# Patient Record
Sex: Male | Born: 1990 | Race: White | Hispanic: No | Marital: Single | Smoking: Current every day smoker
Health system: Southern US, Community
[De-identification: ages and names within clinical notes are randomized; demographics above are authoritative.]

## PROBLEM LIST (undated history)

## (undated) DIAGNOSIS — F909 Attention-deficit hyperactivity disorder, unspecified type: Secondary | ICD-10-CM

## (undated) DIAGNOSIS — F319 Bipolar disorder, unspecified: Secondary | ICD-10-CM

## (undated) HISTORY — PX: TYMPANOSTOMY TUBE PLACEMENT: SHX32

---
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
## 2009-12-19 ENCOUNTER — Emergency Department (HOSPITAL_COMMUNITY): Admission: EM | Admit: 2009-12-19 | Discharge: 2009-12-19 | Payer: Self-pay | Admitting: Emergency Medicine

## 2011-05-04 ENCOUNTER — Encounter: Payer: Self-pay | Admitting: Emergency Medicine

## 2011-05-04 ENCOUNTER — Emergency Department (HOSPITAL_COMMUNITY): Payer: Medicaid Other

## 2011-05-04 ENCOUNTER — Emergency Department (HOSPITAL_COMMUNITY)
Admission: EM | Admit: 2011-05-04 | Discharge: 2011-05-04 | Disposition: A | Discharge status: Home / Self Care | Payer: Medicaid Other | Attending: Emergency Medicine | Admitting: Emergency Medicine

## 2011-05-04 DIAGNOSIS — S60229A Contusion of unspecified hand, initial encounter: Secondary | ICD-10-CM | POA: Insufficient documentation

## 2011-05-04 DIAGNOSIS — M79609 Pain in unspecified limb: Secondary | ICD-10-CM | POA: Insufficient documentation

## 2011-05-04 DIAGNOSIS — Z5189 Encounter for other specified aftercare: Secondary | ICD-10-CM

## 2011-05-04 DIAGNOSIS — S0180XA Unspecified open wound of other part of head, initial encounter: Secondary | ICD-10-CM | POA: Insufficient documentation

## 2011-05-04 DIAGNOSIS — F17200 Nicotine dependence, unspecified, uncomplicated: Secondary | ICD-10-CM | POA: Insufficient documentation

## 2011-05-04 MED ORDER — TETANUS-DIPHTH-ACELL PERTUSSIS 5-2.5-18.5 LF-MCG/0.5 IM SUSP
0.5000 mL | Freq: Once | INTRAMUSCULAR | Status: AC
  Administered 2011-05-04: 0.5 mL via INTRAMUSCULAR
  Filled 2011-05-04: qty 0.5

## 2011-05-04 NOTE — ED Provider Notes (Signed)
Medical screening examination/treatment/procedure(s) were performed by non-physician practitioner and as supervising physician I was immediately available for consultation/collaboration.   Caisley Baxendale L Jorryn Casagrande, MD 05/04/11 2249 
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
## 2012-02-03 ENCOUNTER — Encounter (HOSPITAL_COMMUNITY): Payer: Self-pay | Admitting: *Deleted

## 2012-02-03 ENCOUNTER — Emergency Department (HOSPITAL_COMMUNITY)
Admission: EM | Admit: 2012-02-03 | Discharge: 2012-02-03 | Disposition: A | Discharge status: Home / Self Care | Payer: Medicaid Other | Attending: Emergency Medicine | Admitting: Emergency Medicine

## 2012-02-03 DIAGNOSIS — M25529 Pain in unspecified elbow: Secondary | ICD-10-CM | POA: Insufficient documentation

## 2012-02-03 DIAGNOSIS — IMO0001 Reserved for inherently not codable concepts without codable children: Secondary | ICD-10-CM | POA: Insufficient documentation

## 2012-02-03 DIAGNOSIS — R609 Edema, unspecified: Secondary | ICD-10-CM | POA: Insufficient documentation

## 2012-02-03 DIAGNOSIS — L0390 Cellulitis, unspecified: Secondary | ICD-10-CM

## 2012-02-03 DIAGNOSIS — M25429 Effusion, unspecified elbow: Secondary | ICD-10-CM | POA: Insufficient documentation

## 2012-02-03 DIAGNOSIS — L539 Erythematous condition, unspecified: Secondary | ICD-10-CM | POA: Insufficient documentation

## 2012-02-03 MED ORDER — DOXYCYCLINE HYCLATE 100 MG PO CAPS: 100.0000 mg | ORAL_CAPSULE | Freq: Two times a day (BID) | ORAL | Status: AC

## 2012-02-03 MED ORDER — DOXYCYCLINE HYCLATE 100 MG PO TABS
100.0000 mg | ORAL_TABLET | Freq: Once | ORAL | Status: AC
  Administered 2012-02-03: 100 mg via ORAL
  Filled 2012-02-03: qty 1

## 2012-02-03 MED ORDER — HYDROCODONE-ACETAMINOPHEN 5-325 MG PO TABS
ORAL_TABLET | ORAL | Status: AC
Start: 2012-02-03 — End: 2012-02-13

## 2012-02-03 MED ORDER — HYDROCODONE-ACETAMINOPHEN 5-325 MG PO TABS
1.0000 | ORAL_TABLET | Freq: Once | ORAL | Status: AC
  Administered 2012-02-03: 1 via ORAL
  Filled 2012-02-03: qty 1

## 2012-02-03 NOTE — ED Notes (Signed)
Pt reports spider bite on right elbow, sustained 2 days ago.  Elbow swollen, red and pt reporting throbbing pain.

## 2012-02-03 NOTE — ED Provider Notes (Signed)
History     CSN: [id number]  Arrival date & time 02/03/12  2011   None     Chief Complaint  Patient presents with  . Insect Bite    (Consider location/radiation/quality/duration/timing/severity/associated sxs/prior treatment) HPI Comments: Patient c/o pain, redness and swelling to the right posterior elbow for 2 days.  States that he believes that he was stung or bitten by something.  Pain is worse with flexion and improves with rest.  He states that he tried to "pop it" with a needle, but area did not drain.  He denies numbness or weakness.    The history is provided by the patient.    History reviewed. No pertinent past medical history.  History reviewed. No pertinent past surgical history.  History reviewed. No pertinent family history.  History  Substance Use Topics  . Smoking status: Current Everyday Smoker -- 1.0 packs/day    Types: Cigarettes  . Smokeless tobacco: Not on file  . Alcohol Use: No      Review of Systems  Constitutional: Negative for fever and chills.  Genitourinary: Negative for dysuria and difficulty urinating.  Musculoskeletal: Positive for joint swelling and arthralgias. Negative for back pain.  Skin: Positive for color change and wound.  Neurological: Negative for dizziness, weakness, numbness and headaches.  All other systems reviewed and are negative.    Allergies  Sulfa drugs cross reactors and Penicillins  Home Medications  No current outpatient prescriptions on file.  BP 137/89  Pulse 71  Temp 98.1 F (36.7 C)  Resp 16  Ht 6\' 3"  (1.905 m)  Wt 175 lb (79.379 kg)  BMI 21.87 kg/m2  SpO2 100%  Physical Exam  Nursing note and vitals reviewed. Constitutional: He is oriented to person, place, and time. He appears well-developed and well-nourished. No distress.  HENT:  Head: Normocephalic and atraumatic.  Cardiovascular: Normal rate, regular rhythm and normal heart sounds.   Pulmonary/Chest: Effort normal and breath sounds  normal.  Musculoskeletal: He exhibits edema and tenderness.       Right elbow: He exhibits swelling. He exhibits normal range of motion, no effusion, no deformity and no laceration.       Arms:      ttp of the posterior right elbow. Localized erythema, slight induration, no fluctuance or drainage.  Radial pulse is brisk, sensation intact.  CR< 2 sec.  Patient has full ROM.  Neurological: He is alert and oriented to person, place, and time. He exhibits normal muscle tone. Coordination normal.  Skin: Skin is warm and dry. There is erythema.       See MS exam    ED Course  Procedures (including critical care time)  Labs Reviewed - No data to display      MDM    Localized area of edema an erythema to the right posterior elbow.  Leading edge of erythema was marked by me. Likely cellulitis vs early abscess. No drainage, or fluctuance at this time.  Distal sensation intact, grip strength is strong and equal biltaterally, CR< 2sec.   Patient agrees to return here in 1-2 days for recheck.  I will prescribe doxy and norco #24.     Patient / Family / Caregiver understand and agree with initial ED impression and plan with expectations set for ED visit. Pt stable in ED with no significant deterioration in condition. Pt feels improved after observation and/or treatment in ED.       Rochella Benner L. [place], [place] 02/05/12 4098

## 2012-02-03 NOTE — ED Notes (Signed)
Noticed a possible bite to the right elbow, denies any drainage. Area about 5cm diameter. Swelling & redness noted.

## 2012-02-03 NOTE — ED Notes (Signed)
Pt alert & oriented x4, stable gait. Pt given discharge instructions, paperwork & prescription(s). Patient instructed to stop at the registration desk to finish any additional paperwork. pt verbalized understanding. Pt left department w/ no further questions.  

## 2012-02-06 ENCOUNTER — Ambulatory Visit (HOSPITAL_COMMUNITY): Payer: Medicaid Other

## 2012-02-06 ENCOUNTER — Encounter (HOSPITAL_COMMUNITY): Payer: Self-pay | Admitting: *Deleted

## 2012-02-06 ENCOUNTER — Emergency Department (HOSPITAL_COMMUNITY)
Admission: EM | Admit: 2012-02-06 | Discharge: 2012-02-06 | Disposition: A | Discharge status: Home / Self Care | Payer: Medicaid Other | Attending: Emergency Medicine | Admitting: Emergency Medicine

## 2012-02-06 ENCOUNTER — Telehealth (HOSPITAL_COMMUNITY): Payer: Self-pay | Admitting: Oncology

## 2012-02-06 DIAGNOSIS — L089 Local infection of the skin and subcutaneous tissue, unspecified: Secondary | ICD-10-CM | POA: Insufficient documentation

## 2012-02-06 DIAGNOSIS — Z880 Allergy status to penicillin: Secondary | ICD-10-CM | POA: Insufficient documentation

## 2012-02-06 DIAGNOSIS — F17200 Nicotine dependence, unspecified, uncomplicated: Secondary | ICD-10-CM | POA: Insufficient documentation

## 2012-02-06 DIAGNOSIS — Z882 Allergy status to sulfonamides status: Secondary | ICD-10-CM | POA: Insufficient documentation

## 2012-02-06 DIAGNOSIS — L0390 Cellulitis, unspecified: Secondary | ICD-10-CM

## 2012-02-06 DIAGNOSIS — W57XXXA Bitten or stung by nonvenomous insect and other nonvenomous arthropods, initial encounter: Secondary | ICD-10-CM

## 2012-02-06 MED ORDER — VANCOMYCIN HCL IN DEXTROSE 1-5 GM/200ML-% IV SOLN
1000.0000 mg | INTRAVENOUS | Status: AC
  Filled 2012-02-06 (×3): qty 200

## 2012-02-06 MED ORDER — VANCOMYCIN HCL IN DEXTROSE 1-5 GM/200ML-% IV SOLN
1000.0000 mg | Freq: Once | INTRAVENOUS | Status: AC
  Administered 2012-02-06: 1000 mg via INTRAVENOUS
  Filled 2012-02-06: qty 200

## 2012-02-06 MED ORDER — HYDROCODONE-ACETAMINOPHEN 5-325 MG PO TABS
1.0000 | ORAL_TABLET | Freq: Once | ORAL | Status: AC
  Administered 2012-02-06: 1 via ORAL
  Filled 2012-02-06: qty 1

## 2012-02-06 NOTE — ED Provider Notes (Signed)
History     CSN: [id number]  Arrival date & time 02/06/12  8469   First MD Initiated Contact with Patient 02/06/12 0408      Chief Complaint  Patient presents with  . Insect Bite    (Consider location/radiation/quality/duration/timing/severity/associated sxs/prior treatment) HPI  Kevin Robertson is a 21 y.o. male who presents to the Emergency Department complaining of increased redness, swelling to insect bite to his right elbow region. He was seen and treatment begun on 02/03/12. At that time the edges of the erythema were marked. The erythema and dwelling have extended well beyond the marked border.He denies fever, chills. He reports increased pain to the site. He has been compliant with his doxycycline.    History reviewed. No pertinent past medical history.  History reviewed. No pertinent past surgical history.  No family history on file.  History  Substance Use Topics  . Smoking status: Current Everyday Smoker -- 1.0 packs/day    Types: Cigarettes  . Smokeless tobacco: Not on file  . Alcohol Use: No      Review of Systems  Constitutional: Negative for fever.       10 Systems reviewed and are negative for acute change except as noted in the HPI.  HENT: Negative for congestion.   Eyes: Negative for discharge and redness.  Respiratory: Negative for cough and shortness of breath.   Cardiovascular: Negative for chest pain.  Gastrointestinal: Negative for vomiting and abdominal pain.  Musculoskeletal: Negative for back pain.  Skin: Negative for rash.       Increased erythema, swelling to insect bite site  Neurological: Negative for syncope, numbness and headaches.  Psychiatric/Behavioral:       No behavior change.    Allergies  Sulfa drugs cross reactors and Penicillins  Home Medications   Current Outpatient Rx  Name Route Sig Dispense Refill  . DOXYCYCLINE HYCLATE 100 MG PO CAPS Oral Take 1 capsule (100 mg total) by mouth 2 (two) times daily. 20 capsule 0  .  HYDROCODONE-ACETAMINOPHEN 5-325 MG PO TABS  Take one-two tabs po q 4-6 hrs prn pain 24 tablet 0    BP 110/75  Pulse 70  Temp 97.4 F (36.3 C) (Oral)  Resp 20  Ht 6\' 3"  (1.905 m)  Wt 175 lb (79.379 kg)  BMI 21.87 kg/m2  SpO2 100%  Physical Exam  Nursing note and vitals reviewed. Constitutional:       Awake, alert, nontoxic appearance.  HENT:  Head: Atraumatic.  Eyes: Right eye exhibits no discharge. Left eye exhibits no discharge.  Neck: Neck supple.  Pulmonary/Chest: Effort normal. He exhibits no tenderness.  Abdominal: Soft. There is no tenderness. There is no rebound.  Musculoskeletal: He exhibits no tenderness.       Baseline ROM, no obvious new focal weakness.  Neurological:       Mental status and motor strength appears baseline for patient and situation.  Skin: No rash noted. There is erythema.       Area of erythema surrounding the nsect bite to the patients right elbow has increased from the marked area delineated on 02/03/12. Now 5 x 12 cm  Psychiatric: He has a normal mood and affect.    ED Course  Procedures (including critical care time)      MDM  Patient with infected insect bite to right elbow area that has increased erythema and swelling since initiation of PO antibiotic treatment on 02/03/12. Began IV antibiotics. Will arrange for [facility]. Pt stable in ED with no significant deterioration in condition.The patient appears reasonably screened and/or stabilized for discharge and I doubt any other medical condition or other [facility] requiring further screening, evaluation, or treatment in the ED at this time prior to discharge.  MDM Reviewed: nursing note and vitals           Nicoletta Dress. Colon Branch, MD 02/06/12 [phone]

## 2012-02-06 NOTE — ED Notes (Signed)
Pt reports being seen on 7/2 for what he believes is a spider bite on right elbow.  Skin was marked on that visit, and pt was instructed to return if redness extended past marking.  Area red, swollen and warm to touch.

## 2012-02-06 NOTE — ED Provider Notes (Signed)
Medical screening examination/treatment/procedure(s) were performed by non-physician practitioner and as supervising physician I was immediately available for consultation/collaboration.   Laray Anger, DO 02/06/12 1657

## 2012-02-07 ENCOUNTER — Encounter (HOSPITAL_COMMUNITY): Payer: Medicaid Other | Attending: Emergency Medicine

## 2012-02-07 ENCOUNTER — Telehealth (HOSPITAL_COMMUNITY): Payer: Self-pay

## 2012-02-07 NOTE — Telephone Encounter (Signed)
Patient did not show for appointment for IV antibiotics.  Message left on voicemail for patient to contact clinic regarding appointment.

## 2012-02-08 ENCOUNTER — Ambulatory Visit (HOSPITAL_COMMUNITY): Payer: Medicaid Other

## 2012-02-09 ENCOUNTER — Encounter (HOSPITAL_COMMUNITY): Payer: Medicaid Other

## 2013-12-09 ENCOUNTER — Emergency Department (HOSPITAL_COMMUNITY): Payer: Medicaid Other

## 2013-12-09 ENCOUNTER — Encounter (HOSPITAL_COMMUNITY): Payer: Self-pay | Admitting: Emergency Medicine

## 2013-12-09 ENCOUNTER — Emergency Department (HOSPITAL_COMMUNITY)
Admission: EM | Admit: 2013-12-09 | Discharge: 2013-12-09 | Disposition: A | Discharge status: Home / Self Care | Payer: Medicaid Other | Attending: Emergency Medicine | Admitting: Emergency Medicine

## 2013-12-09 DIAGNOSIS — Y92410 Unspecified street and highway as the place of occurrence of the external cause: Secondary | ICD-10-CM | POA: Insufficient documentation

## 2013-12-09 DIAGNOSIS — M549 Dorsalgia, unspecified: Secondary | ICD-10-CM

## 2013-12-09 DIAGNOSIS — Z880 Allergy status to penicillin: Secondary | ICD-10-CM | POA: Insufficient documentation

## 2013-12-09 DIAGNOSIS — S0993XA Unspecified injury of face, initial encounter: Secondary | ICD-10-CM | POA: Insufficient documentation

## 2013-12-09 DIAGNOSIS — S3981XA Other specified injuries of abdomen, initial encounter: Secondary | ICD-10-CM | POA: Insufficient documentation

## 2013-12-09 DIAGNOSIS — IMO0002 Reserved for concepts with insufficient information to code with codable children: Secondary | ICD-10-CM | POA: Insufficient documentation

## 2013-12-09 DIAGNOSIS — Y9389 Activity, other specified: Secondary | ICD-10-CM | POA: Insufficient documentation

## 2013-12-09 DIAGNOSIS — R109 Unspecified abdominal pain: Secondary | ICD-10-CM

## 2013-12-09 DIAGNOSIS — M542 Cervicalgia: Secondary | ICD-10-CM

## 2013-12-09 DIAGNOSIS — F17200 Nicotine dependence, unspecified, uncomplicated: Secondary | ICD-10-CM | POA: Insufficient documentation

## 2013-12-09 DIAGNOSIS — S199XXA Unspecified injury of neck, initial encounter: Principal | ICD-10-CM

## 2013-12-09 LAB — BASIC METABOLIC PANEL
BUN: 13 mg/dL (ref 6–23)
CO2: 28 mEq/L (ref 19–32)
Calcium: 9.4 mg/dL (ref 8.4–10.5)
Chloride: 97 mEq/L (ref 96–112)
Creatinine, Ser: 1.18 mg/dL (ref 0.50–1.35)
GFR calc Af Amer: >90 mL/min (ref >90)
GFR calc non Af Amer: 85 mL/min — ABNORMAL LOW (ref >90)
Glucose, Bld: 77 mg/dL (ref 70–99)
Potassium: 3.8 mEq/L (ref 3.7–5.3)
Sodium: 136 mEq/L — ABNORMAL LOW (ref 137–147)

## 2013-12-09 LAB — CBC WITH DIFFERENTIAL/PLATELET
Basophils Absolute: 0.1 10*3/uL (ref 0.0–0.1)
Basophils Relative: 1 % (ref 0–1)
Eosinophils Absolute: 0.2 10*3/uL (ref 0.0–0.7)
Eosinophils Relative: 3 % (ref 0–5)
HCT: 44.3 % (ref 39.0–52.0)
Hemoglobin: 15.5 g/dL (ref 13.0–17.0)
Lymphocytes Relative: 19 % (ref 12–46)
Lymphs Abs: 1.6 10*3/uL (ref 0.7–4.0)
MCH: 30.8 pg (ref 26.0–34.0)
MCHC: 35 g/dL (ref 30.0–36.0)
MCV: 88.1 fL (ref 78.0–100.0)
Monocytes Absolute: 0.6 10*3/uL (ref 0.1–1.0)
Monocytes Relative: 8 % (ref 3–12)
Neutro Abs: 5.7 10*3/uL (ref 1.7–7.7)
Neutrophils Relative %: 69 % (ref 43–77)
Platelets: 178 10*3/uL (ref 150–400)
RBC: 5.03 MIL/uL (ref 4.22–5.81)
RDW: 13.1 % (ref 11.5–15.5)
WBC: 8.2 10*3/uL (ref 4.0–10.5)

## 2013-12-09 MED ORDER — SODIUM CHLORIDE 0.9 % IV BOLUS (SEPSIS)
1000.0000 mL | Freq: Once | INTRAVENOUS | Status: AC
  Administered 2013-12-09: 1000 mL via INTRAVENOUS

## 2013-12-09 MED ORDER — IOHEXOL 300 MG/ML  SOLN
100.0000 mL | Freq: Once | INTRAMUSCULAR | Status: AC | PRN
  Administered 2013-12-09: 100 mL via INTRAVENOUS

## 2013-12-09 MED ORDER — CYCLOBENZAPRINE HCL 10 MG PO TABS: 10.0000 mg | ORAL_TABLET | Freq: Three times a day (TID) | ORAL | Status: DC | PRN

## 2013-12-09 MED ORDER — OXYCODONE-ACETAMINOPHEN 5-325 MG PO TABS: 1.0000 | ORAL_TABLET | ORAL | Status: DC | PRN

## 2013-12-09 MED ORDER — IBUPROFEN 600 MG PO TABS: 600.0000 mg | ORAL_TABLET | Freq: Four times a day (QID) | ORAL | Status: DC | PRN

## 2013-12-09 MED ORDER — KETOROLAC TROMETHAMINE 30 MG/ML IJ SOLN
30.0000 mg | Freq: Once | INTRAMUSCULAR | Status: AC
  Administered 2013-12-09: 30 mg via INTRAVENOUS
  Filled 2013-12-09: qty 1

## 2013-12-09 MED ORDER — HYDROMORPHONE HCL PF 1 MG/ML IJ SOLN
1.0000 mg | Freq: Once | INTRAMUSCULAR | Status: AC
  Administered 2013-12-09: 1 mg via INTRAVENOUS
  Filled 2013-12-09: qty 1
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
## 2013-12-09 NOTE — ED Notes (Signed)
Pt states his pain isn't any better.

## 2013-12-17 ENCOUNTER — Emergency Department (HOSPITAL_COMMUNITY)
Admission: EM | Admit: 2013-12-17 | Discharge: 2013-12-17 | Disposition: A | Discharge status: Home / Self Care | Payer: Medicaid Other | Attending: Emergency Medicine | Admitting: Emergency Medicine

## 2013-12-17 ENCOUNTER — Encounter (HOSPITAL_COMMUNITY): Payer: Self-pay | Admitting: Emergency Medicine

## 2013-12-17 DIAGNOSIS — Z8659 Personal history of other mental and behavioral disorders: Secondary | ICD-10-CM | POA: Insufficient documentation

## 2013-12-17 DIAGNOSIS — S239XXA Sprain of unspecified parts of thorax, initial encounter: Secondary | ICD-10-CM | POA: Diagnosis not present

## 2013-12-17 DIAGNOSIS — F17200 Nicotine dependence, unspecified, uncomplicated: Secondary | ICD-10-CM | POA: Insufficient documentation

## 2013-12-17 DIAGNOSIS — M549 Dorsalgia, unspecified: Secondary | ICD-10-CM | POA: Diagnosis present

## 2013-12-17 DIAGNOSIS — Z880 Allergy status to penicillin: Secondary | ICD-10-CM | POA: Insufficient documentation

## 2013-12-17 DIAGNOSIS — Y9389 Activity, other specified: Secondary | ICD-10-CM | POA: Diagnosis not present

## 2013-12-17 DIAGNOSIS — Y92410 Unspecified street and highway as the place of occurrence of the external cause: Secondary | ICD-10-CM | POA: Diagnosis not present

## 2013-12-17 DIAGNOSIS — F909 Attention-deficit hyperactivity disorder, unspecified type: Secondary | ICD-10-CM | POA: Insufficient documentation

## 2013-12-17 DIAGNOSIS — Z79899 Other long term (current) drug therapy: Secondary | ICD-10-CM | POA: Diagnosis not present

## 2013-12-17 HISTORY — DX: Bipolar disorder, unspecified: F31.9

## 2013-12-17 HISTORY — DX: Attention-deficit hyperactivity disorder, unspecified type: F90.9

## 2013-12-17 MED ORDER — CYCLOBENZAPRINE HCL 5 MG PO TABS: 5.0000 mg | ORAL_TABLET | Freq: Three times a day (TID) | ORAL | Status: DC | PRN

## 2013-12-17 MED ORDER — NAPROXEN 500 MG PO TABS: 500.0000 mg | ORAL_TABLET | Freq: Two times a day (BID) | ORAL | Status: DC
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
## 2013-12-17 NOTE — ED Provider Notes (Signed)
Medical screening examination/treatment/procedure(s) were performed by non-physician practitioner and as supervising physician I was immediately available for consultation/collaboration.   EKG Interpretation None       Donnetta HutchingBrian Maximiano Lott, MD 12/17/13 1402

## 2013-12-17 NOTE — ED Notes (Signed)
Patient with no complaints at this time. Respirations even and unlabored. Skin warm/dry. Discharge instructions reviewed with patient at this time. Patient given opportunity to voice concerns/ask questions. Patient discharged at this time and left Emergency Department with steady gait.   

## 2014-04-13 ENCOUNTER — Encounter (HOSPITAL_COMMUNITY): Payer: Self-pay | Admitting: Emergency Medicine

## 2014-04-13 ENCOUNTER — Emergency Department (HOSPITAL_COMMUNITY): Payer: Medicaid Other

## 2014-04-13 ENCOUNTER — Emergency Department (HOSPITAL_COMMUNITY)
Admission: EM | Admit: 2014-04-13 | Discharge: 2014-04-13 | Disposition: A | Discharge status: Home / Self Care | Payer: Medicaid Other | Attending: Emergency Medicine | Admitting: Emergency Medicine

## 2014-04-13 DIAGNOSIS — S60229A Contusion of unspecified hand, initial encounter: Secondary | ICD-10-CM | POA: Diagnosis not present

## 2014-04-13 DIAGNOSIS — S60221A Contusion of right hand, initial encounter: Secondary | ICD-10-CM

## 2014-04-13 DIAGNOSIS — Z8659 Personal history of other mental and behavioral disorders: Secondary | ICD-10-CM | POA: Diagnosis not present

## 2014-04-13 DIAGNOSIS — Z880 Allergy status to penicillin: Secondary | ICD-10-CM | POA: Diagnosis not present

## 2014-04-13 DIAGNOSIS — Y9389 Activity, other specified: Secondary | ICD-10-CM | POA: Diagnosis not present

## 2014-04-13 DIAGNOSIS — F17200 Nicotine dependence, unspecified, uncomplicated: Secondary | ICD-10-CM | POA: Insufficient documentation

## 2014-04-13 DIAGNOSIS — Y9289 Other specified places as the place of occurrence of the external cause: Secondary | ICD-10-CM | POA: Insufficient documentation

## 2014-04-13 DIAGNOSIS — S479XXA Crushing injury of shoulder and upper arm, unspecified arm, initial encounter: Secondary | ICD-10-CM | POA: Diagnosis present

## 2014-04-13 DIAGNOSIS — W230XXA Caught, crushed, jammed, or pinched between moving objects, initial encounter: Secondary | ICD-10-CM | POA: Insufficient documentation

## 2014-04-13 MED ORDER — HYDROCODONE-ACETAMINOPHEN 5-325 MG PO TABS
1.0000 | ORAL_TABLET | ORAL | Status: DC | PRN
Start: 2014-04-13 — End: 2015-01-14

## 2014-04-13 MED ORDER — IBUPROFEN 600 MG PO TABS: 600.0000 mg | ORAL_TABLET | Freq: Three times a day (TID) | ORAL | Status: DC

## 2014-04-13 NOTE — ED Provider Notes (Signed)
CSN: [id number]     Arrival date & time 04/13/14  0910 History   First MD Initiated Contact with Patient 04/13/14 0920     Chief Complaint  Patient presents with  . Arm Injury     (Consider location/radiation/quality/duration/timing/severity/associated sxs/prior Treatment) The history is provided by the patient.   Christop Hippert is a 23 y.o. male presenting with pain and swelling in his right hand after sustaining a crush injury by a edge of a cinder block 2 days ago at work.  He has persistent pain which is worsened with palpation and range of motion.  He had worsened localized swelling initially after the injury which has become less but now covering a larger portion of his hand.  He denies numbness or weakness distal to the injury site.  He has had no medications or treatment prior to arrival for this injury.     Past Medical History  Diagnosis Date  . ADHD (attention deficit hyperactivity disorder)   . Bipolar 1 disorder    Past Surgical History  Procedure Laterality Date  . Tympanostomy tube placement     No family history on file. History  Substance Use Topics  . Smoking status: Current Every Day Smoker -- 1.00 packs/day for 7 years    Types: Cigarettes  . Smokeless tobacco: Never Used  . Alcohol Use: No    Review of Systems  Constitutional: Negative for fever.  Musculoskeletal: Positive for arthralgias and joint swelling. Negative for myalgias.  Neurological: Negative for weakness and numbness.      Allergies  Sulfa drugs cross reactors and Penicillins  Home Medications   Prior to Admission medications   Medication Sig Start Date End Date Taking? Authorizing Provider  ibuprofen (ADVIL,MOTRIN) 600 MG tablet Take 1 tablet (600 mg total) by mouth 3 (three) times daily. 04/13/14   Burgess Amor, PA-C   BP 132/87  Pulse 64  Temp(Src) 98.1 F (36.7 C) (Oral)  Resp 18  Ht  (1.905 m)  Wt 175 lb (79.379 kg)  BMI 21.87 kg/m2  SpO2 100% Physical Exam    Constitutional: He appears well-developed and well-nourished.  HENT:  Head: Atraumatic.  Neck: Normal range of motion.  Cardiovascular:  Pulses equal bilaterally  Musculoskeletal: He exhibits edema and tenderness.       Right hand: He exhibits decreased range of motion, tenderness, bony tenderness and swelling. He exhibits normal capillary refill and no deformity. Normal sensation noted. Normal strength noted.       Hands: Neurological: He is alert. He has normal strength. He displays normal reflexes. No sensory deficit.  Skin: Skin is warm and dry.  Psychiatric: He has a normal mood and affect.    ED Course  Procedures (including critical care time) Labs Review Labs Reviewed - No data to display  Imaging Review Dg Hand Complete Right  04/13/2014   CLINICAL DATA:  23 year old male with pain and posterior right hand at fourth and fifth metacarpophalangeal joint. S/p injury 04/11/2014. History of previous index finger fracture.  EXAM: RIGHT HAND - COMPLETE 3+ VIEW  COMPARISON:  05/04/2011  FINDINGS: There is no evidence of fracture or dislocation. There is no evidence of arthropathy or other focal bone abnormality. Soft tissues are unremarkable.  Previous fracture healed, though not visualized on comparison study.  IMPRESSION: No acute bony abnormality.  Signed,  Yvone Neu. Loreta Ave, DO  [facility]  [facility]   Electronically Signed   By: Gilmer Mor O.D.   On:  04/13/2014 10:06     EKG Interpretation None      MDM   Final diagnoses:  Hand contusion, right, initial encounter    Patients labs and/or radiological studies were viewed and considered during the medical decision making and disposition process. Patient was placed in a Lenora Boys dressing.  He was encouraged ice and elevation.  Ibuprofen.  Followup with Dr. Hilda Lias for recheck if not resolving over the next week.  The patient appears reasonably screened and/or stabilized  for discharge and I doubt any other medical condition or other [facility] requiring further screening, evaluation, or treatment in the ED at this time prior to discharge.     Burgess Amor, PA-C 04/13/14 1050

## 2014-04-13 NOTE — ED Notes (Signed)
PT stated he crushed his right hand in between two cinder block day before yesterday. PT c/o swelling and decreased ROM to right hand.

## 2014-04-13 NOTE — ED Provider Notes (Signed)
Medical screening examination/treatment/procedure(s) were performed by non-physician practitioner and as supervising physician I was immediately available for consultation/collaboration.   EKG Interpretation None       Donnetta Hutching, MD 04/13/14 1252

## 2014-04-13 NOTE — Discharge Instructions (Signed)
Contusion °A contusion is a deep bruise. Contusions are the result of an injury that caused bleeding under the skin. The contusion may turn blue, purple, or yellow. Minor injuries will give you a painless contusion, but more severe contusions may stay painful and swollen for a few weeks.  °CAUSES  °A contusion is usually caused by a blow, trauma, or direct force to an area of the body. °SYMPTOMS  °· Swelling and redness of the injured area. °· Bruising of the injured area. °· Tenderness and soreness of the injured area. °· Pain. °DIAGNOSIS  °The diagnosis can be made by taking a history and physical exam. An X-ray, CT scan, or MRI may be needed to determine if there were any associated injuries, such as fractures. °TREATMENT  °Specific treatment will depend on what area of the body was injured. In general, the best treatment for a contusion is resting, icing, elevating, and applying cold compresses to the injured area. Over-the-counter medicines may also be recommended for pain control. Ask your caregiver what the best treatment is for your contusion. °HOME CARE INSTRUCTIONS  °· Put ice on the injured area. °¨ Put ice in a plastic bag. °¨ Place a towel between your skin and the bag. °¨ Leave the ice on for 15-20 minutes, 3-4 times a day, or as directed by your health care provider. °· Only take over-the-counter or prescription medicines for pain, discomfort, or fever as directed by your caregiver. Your caregiver may recommend avoiding anti-inflammatory medicines (aspirin, ibuprofen, and naproxen) for 48 hours because these medicines may increase bruising. °· Rest the injured area. °· If possible, elevate the injured area to reduce swelling. °SEEK IMMEDIATE MEDICAL CARE IF:  °· You have increased bruising or swelling. °· You have pain that is getting worse. °· Your swelling or pain is not relieved with medicines. °MAKE SURE YOU:  °· Understand these instructions. °· Will watch your condition. °· Will get help right  away if you are not doing well or get worse. °Document Released: 04/30/2005 Document Revised: 07/26/2013 Document Reviewed: 05/26/2011 °ExitCare® Patient Information ©2015 [employer]. This information is not intended to replace advice given to you by your health care provider. Make sure you discuss any questions you have with your health care provider. ° °

## 2015-01-13 ENCOUNTER — Encounter (HOSPITAL_COMMUNITY): Payer: Self-pay | Admitting: Emergency Medicine

## 2015-01-13 ENCOUNTER — Ambulatory Visit (HOSPITAL_COMMUNITY)
Admission: EM | Admit: 2015-01-13 | Discharge: 2015-01-14 | Disposition: A | Discharge status: Home / Self Care | Payer: Medicaid Other | Attending: Emergency Medicine | Admitting: Emergency Medicine

## 2015-01-13 DIAGNOSIS — Z882 Allergy status to sulfonamides status: Secondary | ICD-10-CM | POA: Diagnosis not present

## 2015-01-13 DIAGNOSIS — Y929 Unspecified place or not applicable: Secondary | ICD-10-CM | POA: Diagnosis not present

## 2015-01-13 DIAGNOSIS — F319 Bipolar disorder, unspecified: Secondary | ICD-10-CM | POA: Insufficient documentation

## 2015-01-13 DIAGNOSIS — F17210 Nicotine dependence, cigarettes, uncomplicated: Secondary | ICD-10-CM | POA: Insufficient documentation

## 2015-01-13 DIAGNOSIS — Z880 Allergy status to penicillin: Secondary | ICD-10-CM | POA: Insufficient documentation

## 2015-01-13 DIAGNOSIS — Z419 Encounter for procedure for purposes other than remedying health state, unspecified: Secondary | ICD-10-CM

## 2015-01-13 DIAGNOSIS — S63115A Dislocation of metacarpophalangeal joint of left thumb, initial encounter: Secondary | ICD-10-CM | POA: Insufficient documentation

## 2015-01-13 DIAGNOSIS — F909 Attention-deficit hyperactivity disorder, unspecified type: Secondary | ICD-10-CM | POA: Insufficient documentation

## 2015-01-13 DIAGNOSIS — S6990XD Unspecified injury of unspecified wrist, hand and finger(s), subsequent encounter: Secondary | ICD-10-CM

## 2015-01-13 DIAGNOSIS — S6990XA Unspecified injury of unspecified wrist, hand and finger(s), initial encounter: Secondary | ICD-10-CM

## 2015-01-13 DIAGNOSIS — X58XXXA Exposure to other specified factors, initial encounter: Secondary | ICD-10-CM | POA: Insufficient documentation

## 2015-01-13 NOTE — ED Notes (Signed)
The patient was playing around and he hit his thumb on concrete.  He was seen at [facility] and transferred here.  He rates his pain 10/10.  He was told at [facility] that it was "popped out of socket and chipped".

## 2015-01-13 NOTE — ED Notes (Addendum)
Ortho MD at bedside.

## 2015-01-14 ENCOUNTER — Emergency Department (HOSPITAL_COMMUNITY): Payer: Medicaid Other | Admitting: Anesthesiology

## 2015-01-14 ENCOUNTER — Encounter (HOSPITAL_COMMUNITY)
Admission: EM | Disposition: A | Discharge status: Home / Self Care | Payer: Self-pay | Source: Home / Self Care | Attending: Emergency Medicine

## 2015-01-14 ENCOUNTER — Emergency Department (HOSPITAL_COMMUNITY): Payer: Medicaid Other

## 2015-01-14 ENCOUNTER — Encounter (HOSPITAL_COMMUNITY): Payer: Self-pay | Admitting: Anesthesiology

## 2015-01-14 DIAGNOSIS — F909 Attention-deficit hyperactivity disorder, unspecified type: Secondary | ICD-10-CM | POA: Diagnosis not present

## 2015-01-14 DIAGNOSIS — S63115A Dislocation of metacarpophalangeal joint of left thumb, initial encounter: Secondary | ICD-10-CM | POA: Diagnosis not present

## 2015-01-14 DIAGNOSIS — F17210 Nicotine dependence, cigarettes, uncomplicated: Secondary | ICD-10-CM | POA: Diagnosis not present

## 2015-01-14 DIAGNOSIS — F319 Bipolar disorder, unspecified: Secondary | ICD-10-CM | POA: Diagnosis not present

## 2015-01-14 HISTORY — PX: OPEN REDUCTION INTERNAL FIXATION (ORIF) METACARPAL: SHX6234

## 2015-01-14 SURGERY — OPEN REDUCTION INTERNAL FIXATION (ORIF) METACARPAL
Anesthesia: General | Site: Thumb | Laterality: Left

## 2015-01-14 MED ORDER — BUPIVACAINE-EPINEPHRINE (PF) 0.5% -1:200000 IJ SOLN
INTRAMUSCULAR | Status: AC
  Filled 2015-01-14: qty 30

## 2015-01-14 MED ORDER — LACTATED RINGERS IV SOLN
INTRAVENOUS | Status: DC | PRN
  Administered 2015-01-14: 01:00:00 via INTRAVENOUS

## 2015-01-14 MED ORDER — FENTANYL CITRATE (PF) 250 MCG/5ML IJ SOLN
INTRAMUSCULAR | Status: DC | PRN
  Administered 2015-01-14 (×2): 100 ug via INTRAVENOUS

## 2015-01-14 MED ORDER — PROPOFOL 10 MG/ML IV BOLUS
INTRAVENOUS | Status: DC | PRN
  Administered 2015-01-14: 150 mg via INTRAVENOUS

## 2015-01-14 MED ORDER — HYDROMORPHONE HCL 1 MG/ML IJ SOLN: 0.2500 mg | INTRAMUSCULAR | Status: DC | PRN

## 2015-01-14 MED ORDER — IBUPROFEN 600 MG PO TABS: 600.0000 mg | ORAL_TABLET | Freq: Three times a day (TID) | ORAL | Status: DC | PRN

## 2015-01-14 MED ORDER — LACTATED RINGERS IV SOLN
INTRAVENOUS | Status: DC
  Administered 2015-01-14: 03:00:00 via INTRAVENOUS

## 2015-01-14 MED ORDER — FENTANYL CITRATE (PF) 250 MCG/5ML IJ SOLN
INTRAMUSCULAR | Status: AC
  Filled 2015-01-14: qty 5

## 2015-01-14 MED ORDER — BUPIVACAINE-EPINEPHRINE (PF) 0.5% -1:200000 IJ SOLN
INTRAMUSCULAR | Status: DC | PRN
  Administered 2015-01-14: 20 mL

## 2015-01-14 MED ORDER — ONDANSETRON HCL 4 MG/2ML IJ SOLN
INTRAMUSCULAR | Status: DC | PRN
  Administered 2015-01-14: 4 mg via INTRAVENOUS

## 2015-01-14 MED ORDER — MEPERIDINE HCL 25 MG/ML IJ SOLN: 6.2500 mg | INTRAMUSCULAR | Status: DC | PRN

## 2015-01-14 MED ORDER — SUCCINYLCHOLINE CHLORIDE 20 MG/ML IJ SOLN
INTRAMUSCULAR | Status: DC | PRN
  Administered 2015-01-14: 140 mg via INTRAVENOUS

## 2015-01-14 MED ORDER — LIDOCAINE HCL (PF) 1 % IJ SOLN
5.0000 mL | Freq: Once | INTRAMUSCULAR | Status: AC
  Administered 2015-01-14: 5 mL via INTRADERMAL

## 2015-01-14 MED ORDER — CLINDAMYCIN PHOSPHATE 600 MG/50ML IV SOLN
600.0000 mg | Freq: Once | INTRAVENOUS | Status: AC
  Administered 2015-01-14: 600 mg via INTRAVENOUS

## 2015-01-14 MED ORDER — MIDAZOLAM HCL 2 MG/2ML IJ SOLN
INTRAMUSCULAR | Status: AC
  Filled 2015-01-14: qty 2

## 2015-01-14 MED ORDER — PROPOFOL 10 MG/ML IV BOLUS
INTRAVENOUS | Status: AC
  Filled 2015-01-14: qty 20

## 2015-01-14 MED ORDER — ONDANSETRON HCL 4 MG/2ML IJ SOLN: 4.0000 mg | Freq: Once | INTRAMUSCULAR | Status: DC | PRN

## 2015-01-14 MED ORDER — HYDROCODONE-ACETAMINOPHEN 5-325 MG PO TABS: 1.0000 | ORAL_TABLET | ORAL | Status: DC | PRN

## 2015-01-14 MED ORDER — LIDOCAINE HCL (CARDIAC) 20 MG/ML IV SOLN
INTRAVENOUS | Status: DC | PRN
  Administered 2015-01-14: 80 mg via INTRAVENOUS

## 2015-01-14 MED ORDER — MIDAZOLAM HCL 5 MG/5ML IJ SOLN
INTRAMUSCULAR | Status: DC | PRN
  Administered 2015-01-14: 2 mg via INTRAVENOUS

## 2015-01-14 SURGICAL SUPPLY — 42 items
BANDAGE COBAN STERILE 2 (GAUZE/BANDAGES/DRESSINGS) IMPLANT
BNDG CMPR 9X4 STRL LF SNTH (GAUZE/BANDAGES/DRESSINGS) ×1
BNDG COHESIVE 1X5 TAN STRL LF (GAUZE/BANDAGES/DRESSINGS) IMPLANT
BNDG COHESIVE 4X5 TAN STRL (GAUZE/BANDAGES/DRESSINGS) ×2 IMPLANT
BNDG CONFORM 2 STRL LF (GAUZE/BANDAGES/DRESSINGS) ×3 IMPLANT
BNDG ESMARK 4X9 LF (GAUZE/BANDAGES/DRESSINGS) ×2 IMPLANT
BNDG GAUZE ELAST 4 BULKY (GAUZE/BANDAGES/DRESSINGS) ×2 IMPLANT
CHLORAPREP W/TINT 26ML (MISCELLANEOUS) ×3 IMPLANT
CLOSURE WOUND 1/2 X4 (GAUZE/BANDAGES/DRESSINGS) ×1
CORDS BIPOLAR (ELECTRODE) ×2 IMPLANT
COVER MAYO STAND STRL (DRAPES) ×3 IMPLANT
CUFF TOURNIQUET SINGLE 18IN (TOURNIQUET CUFF) ×2 IMPLANT
DECANTER SPIKE VIAL GLASS SM (MISCELLANEOUS) ×2 IMPLANT
DRAPE C-ARM 42X72 X-RAY (DRAPES) ×3 IMPLANT
DRAPE EXTREMITY T 121X128X90 (DRAPE) ×3 IMPLANT
DRAPE SURG 17X23 STRL (DRAPES) ×3 IMPLANT
DRSG EMULSION OIL 3X3 NADH (GAUZE/BANDAGES/DRESSINGS) ×3 IMPLANT
GAUZE SPONGE 4X4 12PLY STRL (GAUZE/BANDAGES/DRESSINGS) ×3 IMPLANT
GLOVE BIO SURGEON STRL SZ7.5 (GLOVE) ×3 IMPLANT
GLOVE BIOGEL PI IND STRL 8 (GLOVE) ×1 IMPLANT
GLOVE BIOGEL PI INDICATOR 8 (GLOVE) ×2
GOWN STRL REUS W/ TWL XL LVL3 (GOWN DISPOSABLE) ×1 IMPLANT
GOWN STRL REUS W/TWL XL LVL3 (GOWN DISPOSABLE) ×6
KIT BASIN OR (CUSTOM PROCEDURE TRAY) ×3 IMPLANT
NDL HYPO 25GX1X1/2 BEV (NEEDLE) IMPLANT
NEEDLE HYPO 25GX1X1/2 BEV (NEEDLE) ×3 IMPLANT
NS IRRIG 1000ML POUR BTL (IV SOLUTION) ×3 IMPLANT
PAD CAST 4YDX4 CTTN HI CHSV (CAST SUPPLIES) IMPLANT
PADDING CAST ABS 4INX4YD NS (CAST SUPPLIES)
PADDING CAST ABS COTTON 4X4 ST (CAST SUPPLIES) IMPLANT
PADDING CAST COTTON 4X4 STRL (CAST SUPPLIES) ×3
RUBBERBAND STERILE (MISCELLANEOUS) IMPLANT
SPLINT FIBERGLASS 4X15 (CAST SUPPLIES) ×2 IMPLANT
SPONGE GAUZE 4X4 12PLY STER LF (GAUZE/BANDAGES/DRESSINGS) ×2 IMPLANT
STOCKINETTE 4X48 STRL (DRAPES) ×3 IMPLANT
STRIP CLOSURE SKIN 1/2X4 (GAUZE/BANDAGES/DRESSINGS) ×2 IMPLANT
SUT VIC AB 4-0 PS2 27 (SUTURE) ×2 IMPLANT
SUT VICRYL RAPIDE 4/0 PS 2 (SUTURE) ×2 IMPLANT
SYR BULB 3OZ (MISCELLANEOUS) IMPLANT
SYRINGE 10CC LL (SYRINGE) ×2 IMPLANT
TOWEL OR 17X24 6PK STRL BLUE (TOWEL DISPOSABLE) ×3 IMPLANT
UNDERPAD 30X30 INCONTINENT (UNDERPADS AND DIAPERS) ×3 IMPLANT

## 2015-01-14 NOTE — ED Notes (Signed)
Report called to Nurse Anesthetist in OR

## 2015-01-14 NOTE — ED Notes (Addendum)
Pt taken by Dr. Janee Morn to OR with ortho resident

## 2015-01-14 NOTE — Discharge Instructions (Signed)
Discharge Instructions   You have a dressing with a plaster splint incorporated in it. Move your fingers as much as possible, making a full fist and fully opening the fist. Elevate your hand to reduce pain & swelling of the digits.  Ice over the operative site may be helpful to reduce pain & swelling.  DO NOT USE HEAT. Pain medicine has been prescribed for you.  Use your medicine as needed over the first 48 hours, and then you can begin to taper your use.  You may use Tylenol in place of your prescribed pain medication, but not IN ADDITION to it. Leave the dressing in place until you return to our office--THIS IS IMPORTANT, AS THE SPLINT IS PARTIALLY HOLDING YOUR THUMB IN THE RIGHT PLACE!! You may shower, but keep the bandage clean & dry.  You may drive a car when you are off of prescription pain medications and can safely control your vehicle with both hands. Our office will call you to arrange follow-up   Please call [phone] during normal business hours or [phone] after hours for any problems. Including the following:  - excessive redness of the incisions - drainage for more than 4 days - fever of more than 101.5 F  *Please note that pain medications will not be refilled after hours or on weekends.  NO WORK WITH LEFT HAND UNTIL AT LEAST THE FIRST POSTOP APPOINTMENT

## 2015-01-14 NOTE — ED Notes (Signed)
Pt wife and family updated of pt location and status.  Pt wife choosing to leave to get food.  House Coverage made aware.

## 2015-01-14 NOTE — ED Provider Notes (Signed)
MSE was initiated and I personally evaluated the patient and placed orders (if any) at  12:22 AM on January 14, 2015.  The patient appears stable so that the remainder of the MSE may be completed by another provider.  Pt transferred from outside hospital with thumb injury. Pt appears to have had suffered from dislocation, maybe fracture as well. Dr. Janee Morn, Ortho hand accepting the patient, and is at the bedside to take over the care.    Derwood Kaplan, MD 01/14/15 [phone]

## 2015-01-14 NOTE — Anesthesia Procedure Notes (Signed)
Procedure Name: Intubation Date/Time: 01/14/2015 1:08 AM Performed by: Brien Mates D Pre-anesthesia Checklist: Patient identified, Emergency Drugs available, Suction available and Patient being monitored Patient Re-evaluated:Patient Re-evaluated prior to inductionOxygen Delivery Method: Circle system utilized Preoxygenation: Pre-oxygenation with 100% oxygen Intubation Type: IV induction, Rapid sequence and Cricoid Pressure applied Laryngoscope Size: Miller and 2 Grade View: Grade I Tube type: Oral Tube size: 7.5 mm Number of attempts: 1 Airway Equipment and Method: Stylet Placement Confirmation: ETT inserted through vocal cords under direct vision,  positive ETCO2 and breath sounds checked- equal and bilateral Secured at: 23 cm Tube secured with: Tape Dental Injury: Teeth and Oropharynx as per pre-operative assessment

## 2015-01-14 NOTE — Transfer of Care (Signed)
Immediate Anesthesia Transfer of Care Note  Patient: Kevin Robertson  Procedure(s) Performed: Procedure(s): OPEN REDUCTION INTERNAL FIXATION (ORIF) THUMB AND CAPSULE REPAIR (Left)  Patient Location: PACU  Anesthesia Type:General  Level of Consciousness: sedated  Airway & Oxygen Therapy: Patient Spontanous Breathing and Patient connected to face mask oxygen  Post-op Assessment: Report given to RN and Post -op Vital signs reviewed and stable  Post vital signs: Reviewed and stable  Last Vitals:  Filed Vitals:   01/13/15 2353  Pulse: 50  Temp: 36.6 C  Resp: 18    Complications: No apparent anesthesia complications

## 2015-01-14 NOTE — Op Note (Signed)
01/13/2015 - 01/14/2015  1:45 AM  PATIENT:  Kevin Robertson  24 y.o. male  PRE-OPERATIVE DIAGNOSIS:  Complex left thumb MPJ dislocation  POST-OPERATIVE DIAGNOSIS:  Same  PROCEDURE:  Open reduction of left thumb MPJ dislocation and dorsal capsule repair  SURGEON: Kevin Asters. Janee Morn, MD  PHYSICIAN ASSISTANT: Kevin Robertson, OPA-C  ANESTHESIA:  general  SPECIMENS:  None  DRAINS:   None  EBL:  less than 50 mL  PREOPERATIVE INDICATIONS:  Kevin Robertson is a  24 y.o. male with  a complex left thumb MPJ dislocation, unreducible with digital block in emergency department.  The risks benefits and alternatives were discussed with the patient preoperatively including but not limited to the risks of infection, bleeding, nerve injury, cardiopulmonary complications, the need for revision surgery, among others, and the patient verbalized understanding and consented to proceed.  OPERATIVE IMPLANTS:  None  OPERATIVE PROCEDURE:  After receiving prophylactic antibiotics, the patient was escorted to the operative theatre and placed in a supine position.  General anesthesia was administered.A surgical "time-out" was performed during which the planned procedure, proposed operative site, and the correct patient identity were compared to the operative consent and agreement confirmed by the circulating nurse according to current facility policy.  Following application of a tourniquet to the operative extremity, the exposed skin was prepped with Chloraprep and draped in the usual sterile fashion.  The limb was exsanguinated with an Esmarch bandage and the tourniquet inflated to approximately higher than systolic BP  I again attempted closed reduction under general anesthesia, and this was unsuccessful. A dorsal incision was made over the MPJ of the thumb, incising the skin sharply with scalpel. Subcutaneous taste tissues were dissected with blunt spreading dissection. The interval was developed between the  EPL and EPB tendons which were reflected radially and ulnarly. The dorsal capsule was patulous, and a Therapist, nutritional was placed into a dorsal capsular went, across the metacarpal head and in this manner the proximal phalanx was levered back onto the head of the metacarpal. Motion seemed smooth and fluid, the reduction seemed congruent and was confirmed fluoroscopically. There was moderate laxity at the MP joint in extension, but with flexion of 30-40, not as much. With hyperextension stress, the joint did not dislocate. I used a Glorious Peach to make sure that the radial and ulnar collateral ligaments were approximated near-anatomic positions and then the wound is copiously irrigated. The dorsal capsule was closed with 4-0 Vicryls suture in a running fashion. The extensor tendon split was closed similarly. Tourniquet was released some additional hemostasis obtained and the skin was closed with 4-0 Vicryls Rapide running horizontal mattress suture.  Half percent Marcaine with epinephrine was instilled in and around the regions of the skin incision as well as at the proximal palm to provide for a nerve block to the palmar branch of the median nerve and the rest within the carpal tunnel. A bulky dressing with a forearm-based thumb spica plaster splint was constructed and he was awakened and taken to recovery room in stable condition, breathing spontaneously.  DISPOSITION: He will be discharged home with typical instructions, returning in 1-2 weeks, at which time we will obtain x-rays of the left thumb out of the splint and likely place him into a short arm thumb spica cast.

## 2015-01-14 NOTE — Consult Note (Signed)
  ORTHOPAEDIC CONSULTATION HISTORY & PHYSICAL REQUESTING PHYSICIAN: Derwood Kaplan, MD  Chief Complaint: left thumb deformity  HPI: Kevin Robertson is a 24 y.o. male who was horse-playing around his pool around 5:30 this afternoon, when he injured his left thumb.  He was evaluated at [facility] in [place], where closed reduction was attempted and unsuccessful.  I accepted this patient in transfer to our ED at 8:20.  He arrived at [employer].  Past Medical History  Diagnosis Date  . ADHD (attention deficit hyperactivity disorder)   . Bipolar 1 disorder    Past Surgical History  Procedure Laterality Date  . Tympanostomy tube placement     History   Social History  . Marital Status: Single    Spouse Name: N/A  . Number of Children: N/A  . Years of Education: N/A   Social History Main Topics  . Smoking status: Current Every Day Smoker -- 1.00 packs/day for 7 years    Types: Cigarettes  . Smokeless tobacco: Never Used  . Alcohol Use: 1.8 oz/week    3 Shots of liquor per week     Comment: occ  . Drug Use: No  . Sexual Activity: Not on file   Other Topics Concern  . None   Social History Narrative   History reviewed. No pertinent family history. Allergies  Allergen Reactions  . Sulfa Drugs Cross Reactors Anaphylaxis and Other (See Comments)    Seizures.  . Penicillins Hives and Itching   Prior to Admission medications   Medication Sig Start Date End Date Taking? Authorizing Provider  HYDROcodone-acetaminophen (NORCO/VICODIN) 5-325 MG per tablet Take 1 tablet by mouth every 4 (four) hours as needed. 04/13/14   Burgess Amor, PA-C  ibuprofen (ADVIL,MOTRIN) 600 MG tablet Take 1 tablet (600 mg total) by mouth 3 (three) times daily. 04/13/14   Burgess Amor, PA-C   No results found.  Positive ROS: All other systems have been reviewed and were otherwise negative with the exception of those mentioned in the HPI and as above.  Physical Exam: Vitals: Refer to EMR. Constitutional:   WD, WN, NAD HEENT:  NCAT, EOMI Neuro/Psych:  Alert & oriented to person, place, and time; appropriate mood & affect Lymphatic: No generalized extremity edema or lymphadenopathy Extremities / MSK:  The extremities are normal with respect to appearance, ranges of motion, joint stability, muscle strength/tone, sensation, & perfusion except as otherwise noted:  Left thumb NVI, with obvious dorsal Dx at MPJ.  FPL appears intact.  Extensors appear intact.  Dx appears to be straight dorsal.    Xrays reveal dorsal thumb MP dislocation, with sesamoid bones appearing to be within the joint  Assessment: Left thumb MPJ complex dislocation  Plan: After I instilled a digital block, closed reduction was unsuccessful.  Needs to go to OR for open reduction of dislocation.  G/R/O reviewed and consent obtained.  General course of recovery reviewed as well.  Cliffton Asters Janee Morn, MD      Orthopaedic & Hand Surgery [facility] Orthopaedic & Sports Medicine [facility] [address] [place], [place]  [address] Office: [phone] Mobile: [phone]

## 2015-01-14 NOTE — Anesthesia Preprocedure Evaluation (Addendum)
Anesthesia Evaluation  Patient identified by MRN, date of birth, ID band Patient awake    Reviewed: Allergy & Precautions, NPO status , Patient's Chart, lab work & pertinent test results  Airway Mallampati: I  TM Distance: >3 FB Neck ROM: Full    Dental  (+) Teeth Intact, Dental Advisory Given   Pulmonary Current Smoker,    Pulmonary exam normal       Cardiovascular Normal cardiovascular exam    Neuro/Psych    GI/Hepatic   Endo/Other    Renal/GU      Musculoskeletal   Abdominal   Peds  Hematology   Anesthesia Other Findings   Reproductive/Obstetrics                            Anesthesia Physical Anesthesia Plan  ASA: II  Anesthesia Plan: General   Post-op Pain Management:    Induction: Intravenous  Airway Management Planned: Oral ETT  Additional Equipment:   Intra-op Plan:   Post-operative Plan: Extubation in OR  Informed Consent: I have reviewed the patients History and Physical, chart, labs and discussed the procedure including the risks, benefits and alternatives for the proposed anesthesia with the patient or authorized representative who has indicated his/her understanding and acceptance.   Dental advisory given  Plan Discussed with: CRNA, Surgeon and Anesthesiologist  Anesthesia Plan Comments:        Anesthesia Quick Evaluation

## 2015-01-15 ENCOUNTER — Encounter (HOSPITAL_COMMUNITY): Payer: Self-pay | Admitting: Orthopedic Surgery

## 2015-01-15 NOTE — Anesthesia Postprocedure Evaluation (Signed)
Anesthesia Post Note  Patient: Kevin Robertson  Procedure(s) Performed: Procedure(s) (LRB): OPEN REDUCTION INTERNAL FIXATION (ORIF) THUMB AND CAPSULE REPAIR (Left)  Anesthesia type: general  Patient location: PACU  Post pain: Pain level controlled  Post assessment: Patient's Cardiovascular Status Stable  Last Vitals:  Filed Vitals:   01/14/15 0300  BP: 119/83  Pulse: 70  Temp:   Resp: 18    Post vital signs: Reviewed and stable  Level of consciousness: sedated  Complications: No apparent anesthesia complications

## 2015-01-16 ENCOUNTER — Encounter (HOSPITAL_COMMUNITY): Payer: Self-pay | Admitting: Emergency Medicine

## 2015-01-16 ENCOUNTER — Emergency Department (HOSPITAL_COMMUNITY)
Admission: EM | Admit: 2015-01-16 | Discharge: 2015-01-16 | Disposition: A | Discharge status: Home / Self Care | Payer: Medicaid Other | Attending: Emergency Medicine | Admitting: Emergency Medicine

## 2015-01-16 DIAGNOSIS — G8918 Other acute postprocedural pain: Secondary | ICD-10-CM | POA: Insufficient documentation

## 2015-01-16 DIAGNOSIS — Z720 Tobacco use: Secondary | ICD-10-CM | POA: Diagnosis not present

## 2015-01-16 DIAGNOSIS — Z4789 Encounter for other orthopedic aftercare: Secondary | ICD-10-CM

## 2015-01-16 DIAGNOSIS — Z8659 Personal history of other mental and behavioral disorders: Secondary | ICD-10-CM | POA: Diagnosis not present

## 2015-01-16 DIAGNOSIS — Z880 Allergy status to penicillin: Secondary | ICD-10-CM | POA: Diagnosis not present

## 2015-01-16 DIAGNOSIS — M7989 Other specified soft tissue disorders: Secondary | ICD-10-CM | POA: Diagnosis present

## 2015-01-16 NOTE — ED Provider Notes (Signed)
CSN: [id number]     Arrival date & time 01/16/15  1627 History   First MD Initiated Contact with Patient 01/16/15 1636     Chief Complaint  Patient presents with  . Circulatory Problem     (Consider location/radiation/quality/duration/timing/severity/associated sxs/prior Treatment) HPI Comments: Patient is a 24 year old male with recent surgery to his left thumb. He presents with complaints of swelling of his hand and feeling as if the dressing is too tight. He denies any new injury or trauma. He denies any fevers or chills. He denies any chest pain or shortness of breath.  The history is provided by the patient.    Past Medical History  Diagnosis Date  . ADHD (attention deficit hyperactivity disorder)   . Bipolar 1 disorder    Past Surgical History  Procedure Laterality Date  . Tympanostomy tube placement    . Open reduction internal fixation (orif) metacarpal Left 01/14/2015    Procedure: OPEN REDUCTION INTERNAL FIXATION (ORIF) THUMB AND CAPSULE REPAIR;  Surgeon: Mack Hook, MD;  [facility]: [facility] OR;  Service: Orthopedics;  Laterality: Left;   History reviewed. No pertinent family history. History  Substance Use Topics  . Smoking status: Current Every Day Smoker -- 1.00 packs/day for 7 years    Types: Cigarettes  . Smokeless tobacco: Never Used  . Alcohol Use: 1.8 oz/week    3 Shots of liquor per week     Comment: occ    Review of Systems  All other systems reviewed and are negative.     Allergies  Sulfa drugs cross reactors and Penicillins  Home Medications   Prior to Admission medications   Medication Sig Start Date End Date Taking? Authorizing Provider  HYDROcodone-acetaminophen (NORCO/VICODIN) 5-325 MG per tablet Take 1-2 tablets by mouth every 4 (four) hours as needed for severe pain. 01/14/15  Yes Mack Hook, MD  ibuprofen (ADVIL,MOTRIN) 600 MG tablet Take 1 tablet (600 mg total) by mouth every 8 (eight) hours as needed (to reduce inflammation). 01/14/15   Yes Mack Hook, MD   BP 128/80 mmHg  Pulse 65  Temp(Src) 97.7 F (36.5 C) (Oral)  Resp 18  Ht 6\' 2"  (1.88 m)  Wt 175 lb (79.379 kg)  BMI 22.46 kg/m2  SpO2 96% Physical Exam  Constitutional: He is oriented to person, place, and time. He appears well-developed and well-nourished. No distress.  HENT:  Head: Normocephalic and atraumatic.  Neck: Normal range of motion. Neck supple.  Musculoskeletal:  The left hand is noted to have swelling to the thumb and fingers. Capillary refill is brisk and sensation is intact.  Neurological: He is alert and oriented to person, place, and time.  Skin: Skin is warm and dry. He is not diaphoretic.  Nursing note and vitals reviewed.   ED Course  Procedures (including critical care time) Labs Review Labs Reviewed - No data to display  Imaging Review No results found.   EKG Interpretation None      MDM   Final diagnoses:  None    The Coban on the dressing was removed and this relieved the patient's symptoms. This will be rewrapped less tightly and the patient will be discharged to home. He is to continue his recommended postoperative care.    Geoffery Lyons, MD 01/16/15 [phone]

## 2015-01-16 NOTE — Discharge Instructions (Signed)
Wear splint as applied.  Elevate your hand for the next 2 days.  Continue your postoperative treatment recommendations as before.   Cast or Splint Care Casts and splints support injured limbs and keep bones from moving while they heal.  HOME CARE  Keep the cast or splint uncovered during the drying period.  A plaster cast can take 24 to 48 hours to dry.  A fiberglass cast will dry in less than 1 hour.  Do not rest the cast on anything harder than a pillow for 24 hours.  Do not put weight on your injured limb. Do not put pressure on the cast. Wait for your doctor's approval.  Keep the cast or splint dry.  Cover the cast or splint with a plastic bag during baths or wet weather.  If you have a cast over your chest and belly (trunk), take sponge baths until the cast is taken off.  If your cast gets wet, dry it with a towel or blow dryer. Use the cool setting on the blow dryer.  Keep your cast or splint clean. Wash a dirty cast with a damp cloth.  Do not put any objects under your cast or splint.  Do not scratch the skin under the cast with an object. If itching is a problem, use a blow dryer on a cool setting over the itchy area.  Do not trim or cut your cast.  Do not take out the padding from inside your cast.  Exercise your joints near the cast as told by your doctor.  Raise (elevate) your injured limb on 1 or 2 pillows for the first 1 to 3 days. GET HELP IF:  Your cast or splint cracks.  Your cast or splint is too tight or too loose.  You itch badly under the cast.  Your cast gets wet or has a soft spot.  You have a bad smell coming from the cast.  You get an object stuck under the cast.  Your skin around the cast becomes red or sore.  You have new or more pain after the cast is put on. GET HELP RIGHT AWAY IF:  You have fluid leaking through the cast.  You cannot move your fingers or toes.  Your fingers or toes turn blue or white or are cool, painful,  or puffy (swollen).  You have tingling or lose feeling (numbness) around the injured area.  You have bad pain or pressure under the cast.  You have trouble breathing or have shortness of breath.  You have chest pain. Document Released: 11/20/2010 Document Revised: 03/23/2013 Document Reviewed: 01/27/2013 [facility] Patient Information 2015 [place], [place]. This information is not intended to replace advice given to you by your health care provider. Make sure you discuss any questions you have with your health care provider.

## 2015-01-16 NOTE — ED Notes (Signed)
Pt reports had surgery to left wrist. Pt reports swelling/pain to left hand x2 days. Cns intact. Pt reports unable to fell touch of left thumb. Decreased cap refill.

## 2015-01-18 ENCOUNTER — Encounter (HOSPITAL_COMMUNITY): Payer: Self-pay | Admitting: *Deleted

## 2015-01-18 ENCOUNTER — Emergency Department (HOSPITAL_COMMUNITY)
Admission: EM | Admit: 2015-01-18 | Discharge: 2015-01-19 | Disposition: A | Discharge status: Home / Self Care | Payer: Medicaid Other | Attending: Emergency Medicine | Admitting: Emergency Medicine

## 2015-01-18 DIAGNOSIS — S6992XD Unspecified injury of left wrist, hand and finger(s), subsequent encounter: Secondary | ICD-10-CM | POA: Diagnosis present

## 2015-01-18 DIAGNOSIS — Z880 Allergy status to penicillin: Secondary | ICD-10-CM | POA: Insufficient documentation

## 2015-01-18 DIAGNOSIS — Z464 Encounter for fitting and adjustment of orthodontic device: Secondary | ICD-10-CM | POA: Diagnosis not present

## 2015-01-18 DIAGNOSIS — Z8659 Personal history of other mental and behavioral disorders: Secondary | ICD-10-CM | POA: Insufficient documentation

## 2015-01-18 DIAGNOSIS — X58XXXD Exposure to other specified factors, subsequent encounter: Secondary | ICD-10-CM | POA: Diagnosis not present

## 2015-01-18 DIAGNOSIS — Z720 Tobacco use: Secondary | ICD-10-CM | POA: Insufficient documentation

## 2015-01-18 DIAGNOSIS — Z4789 Encounter for other orthopedic aftercare: Secondary | ICD-10-CM

## 2015-01-18 NOTE — ED Notes (Signed)
Pt had hand surgery last Saturday. Pt states his cast got wet earlier today and needs a new splint.

## 2015-01-19 MED ORDER — OXYCODONE-ACETAMINOPHEN 5-325 MG PO TABS
1.0000 | ORAL_TABLET | Freq: Once | ORAL | Status: AC
  Administered 2015-01-19: 1 via ORAL
  Filled 2015-01-19: qty 1

## 2015-01-19 NOTE — ED Provider Notes (Signed)
CSN: [id number]     Arrival date & time 01/18/15  2331 History  This chart was scribed for Zadie Rhine, MD by Tanda Rockers, ED Scribe. This patient was seen in room APA14/APA14 and the patient's care was started at 12:04 AM.    Chief Complaint  Patient presents with  . Hand Injury   Patient is a 24 y.o. male presenting with hand injury. The history is provided by the patient. No language interpreter was used.  Hand Injury Location:  Hand Time since incident:  1 week Injury: yes   Hand location:  L hand Chronicity:  New    HPI Comments: Kevin Robertson is a 24 y.o. male who presents to the Emergency Department for left hand injury that occurred approximately 1 week ago. Pt had surgery on his hand by Dr. Janee Morn on 01/14/2015 (5 days ago) and had thumb spica splint placed. Pt reports that he got his cast wet earlier today and needs a new splint.  Denies any other symptoms.    Past Medical History  Diagnosis Date  . ADHD (attention deficit hyperactivity disorder)   . Bipolar 1 disorder    Past Surgical History  Procedure Laterality Date  . Tympanostomy tube placement    . Open reduction internal fixation (orif) metacarpal Left 01/14/2015    Procedure: OPEN REDUCTION INTERNAL FIXATION (ORIF) THUMB AND CAPSULE REPAIR;  Surgeon: Mack Hook, MD;  Location: [facility] OR;  Service: Orthopedics;  Laterality: Left;   History reviewed. No pertinent family history. History  Substance Use Topics  . Smoking status: Current Every Day Smoker -- 1.00 packs/day for 7 years    Types: Cigarettes  . Smokeless tobacco: Never Used  . Alcohol Use: 1.8 oz/week    3 Shots of liquor per week     Comment: occ    Review of Systems  Respiratory: Negative for shortness of breath.   Cardiovascular: Negative for chest pain.  Musculoskeletal: Positive for arthralgias (Left hand).  Neurological: Negative for weakness and numbness.      Allergies  Sulfa drugs cross reactors and Penicillins  Home  Medications   Prior to Admission medications   Not on File   Triage Vitals: BP 124/69 mmHg  Pulse 76  Temp(Src) 98.5 F (36.9 C) (Oral)  Resp 18  Ht  (1.88 m)  Wt 175 lb (79.379 kg)  BMI 22.46 kg/m2  SpO2 99%   Physical Exam  Nursing note and vitals reviewed. CONSTITUTIONAL: Well developed/well nourished HEAD: Normocephalic/atraumatic EYES: EOMI/PERRL ENMT: Mucous membranes moist NEURO: Pt is awake/alert/appropriate, moves all extremitiesx4.  No facial droop.   EXTREMITIES: full ROM SKIN: warm, color normal. Well healing incision noted to left thumb. No drainage or erythema.  PSYCH: no abnormalities of mood noted, alert and oriented to situation   ED Course  Procedures   DIAGNOSTIC STUDIES: Oxygen Saturation is 99% on RA, normal by my interpretation.    COORDINATION OF CARE: 12:05 AM-Discussed treatment plan which includes thumb spica splint with pt at bedside and pt agreed to plan.  SPLINT APPLICATION Date/Time: 01/19/15 Authorized by: Joya Gaskins Consent: Verbal consent obtained. Risks and benefits: risks, benefits and alternatives were discussed Consent given by: patient Splint applied by: orthopedic technician Location details: left hand Splint type: thumb spica Supplies used: fiberglass Post-procedure: The splinted body part was neurovascularly unchanged following the procedure. Patient tolerance: Patient tolerated the procedure well with no immediate complications.  Medications  oxyCODONE-acetaminophen (PERCOCET/ROXICET) 5-325 MG per tablet 1 tablet (1 tablet Oral Given 01/19/15 0034)  Pt advised to call his orthopedist tomorrow for pain medicine refill Pt agreeable Well appearing/stable for d/c home    MDM   Final diagnoses:  Cast discomfort    .Nursing notes including past medical history and social history reviewed and considered in documentation Previous records reviewed and considered   I personally performed the services  described in this documentation, which was scribed in my presence. The recorded information has been reviewed and is accurate.      Zadie Rhine, MD 01/19/15 [phone]

## 2015-02-20 ENCOUNTER — Emergency Department (HOSPITAL_COMMUNITY): Payer: Medicaid Other

## 2015-02-20 ENCOUNTER — Emergency Department (HOSPITAL_COMMUNITY)
Admission: EM | Admit: 2015-02-20 | Discharge: 2015-02-20 | Disposition: A | Discharge status: Home / Self Care | Payer: Medicaid Other | Attending: Emergency Medicine | Admitting: Emergency Medicine

## 2015-02-20 ENCOUNTER — Encounter (HOSPITAL_COMMUNITY): Payer: Self-pay | Admitting: Emergency Medicine

## 2015-02-20 DIAGNOSIS — Z720 Tobacco use: Secondary | ICD-10-CM | POA: Diagnosis not present

## 2015-02-20 DIAGNOSIS — Z880 Allergy status to penicillin: Secondary | ICD-10-CM | POA: Insufficient documentation

## 2015-02-20 DIAGNOSIS — S60012A Contusion of left thumb without damage to nail, initial encounter: Secondary | ICD-10-CM | POA: Insufficient documentation

## 2015-02-20 DIAGNOSIS — Y990 Civilian activity done for income or pay: Secondary | ICD-10-CM | POA: Insufficient documentation

## 2015-02-20 DIAGNOSIS — Y9289 Other specified places as the place of occurrence of the external cause: Secondary | ICD-10-CM | POA: Insufficient documentation

## 2015-02-20 DIAGNOSIS — Y9389 Activity, other specified: Secondary | ICD-10-CM | POA: Diagnosis not present

## 2015-02-20 DIAGNOSIS — Z8659 Personal history of other mental and behavioral disorders: Secondary | ICD-10-CM | POA: Insufficient documentation

## 2015-02-20 DIAGNOSIS — W231XXA Caught, crushed, jammed, or pinched between stationary objects, initial encounter: Secondary | ICD-10-CM | POA: Diagnosis not present

## 2015-02-20 DIAGNOSIS — S6992XA Unspecified injury of left wrist, hand and finger(s), initial encounter: Secondary | ICD-10-CM | POA: Diagnosis present

## 2015-02-20 DIAGNOSIS — Z79899 Other long term (current) drug therapy: Secondary | ICD-10-CM | POA: Insufficient documentation

## 2015-02-20 MED ORDER — OXYCODONE-ACETAMINOPHEN 5-325 MG PO TABS
1.0000 | ORAL_TABLET | Freq: Once | ORAL | Status: AC
  Administered 2015-02-20: 1 via ORAL
  Filled 2015-02-20: qty 1

## 2015-02-20 MED ORDER — OXYCODONE-ACETAMINOPHEN 5-325 MG PO TABS: 1.0000 | ORAL_TABLET | ORAL | Status: DC | PRN

## 2015-02-20 MED ORDER — PREDNISONE 10 MG PO TABS: ORAL_TABLET | ORAL | Status: DC

## 2015-02-20 NOTE — Discharge Instructions (Signed)
Contusion °A contusion is a deep bruise. Contusions are the result of an injury that caused bleeding under the skin. The contusion may turn blue, purple, or yellow. Minor injuries will give you a painless contusion, but more severe contusions may stay painful and swollen for a few weeks.  °CAUSES  °A contusion is usually caused by a blow, trauma, or direct force to an area of the body. °SYMPTOMS  °· Swelling and redness of the injured area. °· Bruising of the injured area. °· Tenderness and soreness of the injured area. °· Pain. °DIAGNOSIS  °The diagnosis can be made by taking a history and physical exam. An X-ray, CT scan, or MRI may be needed to determine if there were any associated injuries, such as fractures. °TREATMENT  °Specific treatment will depend on what area of the body was injured. In general, the best treatment for a contusion is resting, icing, elevating, and applying cold compresses to the injured area. Over-the-counter medicines may also be recommended for pain control. Ask your caregiver what the best treatment is for your contusion. °HOME CARE INSTRUCTIONS  °· Put ice on the injured area. °¨ Put ice in a plastic bag. °¨ Place a towel between your skin and the bag. °¨ Leave the ice on for 15-20 minutes, 3-4 times a day, or as directed by your health care provider. °· Only take over-the-counter or prescription medicines for pain, discomfort, or fever as directed by your caregiver. Your caregiver may recommend avoiding anti-inflammatory medicines (aspirin, ibuprofen, and naproxen) for 48 hours because these medicines may increase bruising. °· Rest the injured area. °· If possible, elevate the injured area to reduce swelling. °SEEK IMMEDIATE MEDICAL CARE IF:  °· You have increased bruising or swelling. °· You have pain that is getting worse. °· Your swelling or pain is not relieved with medicines. °MAKE SURE YOU:  °· Understand these instructions. °· Will watch your condition. °· Will get help right  away if you are not doing well or get worse. °Document Released: 04/30/2005 Document Revised: 07/26/2013 Document Reviewed: 05/26/2011 °ExitCare® Patient Information ©2015 [employer]. This information is not intended to replace advice given to you by your health care provider. Make sure you discuss any questions you have with your health care provider. ° °

## 2015-02-20 NOTE — ED Notes (Signed)
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 Complaining of pain and increased swelling to left thumb.

## 2015-02-21 NOTE — ED Provider Notes (Signed)
CSN: [id number]     Arrival date & time 02/20/15  2153 History   First MD Initiated Contact with Patient 02/20/15 2242     Chief Complaint  Patient presents with  . Finger Injury     (Consider location/radiation/quality/duration/timing/severity/associated sxs/prior Treatment) The history is provided by the patient and the spouse.   Kevin Robertson is a 24 y.o. right handed male with a history of orif of his left thumb one month ago by Dr. Janee Morn due to dislocation presenting with increased pain and swelling in this digit since accidentally smashing it between a brick and a trowel today (works as a Chiropractor).  He completed his but with significant discomfort.  He denies numbness in the distal thumb, but endorses decreased range of motion,  Although states he has had decreased ROM since his surgery.  He has taken a goody powder and has used ice and elevation without relief of pain or swelling.     Past Medical History  Diagnosis Date  . ADHD (attention deficit hyperactivity disorder)   . Bipolar 1 disorder    Past Surgical History  Procedure Laterality Date  . Tympanostomy tube placement    . Open reduction internal fixation (orif) metacarpal Left 01/14/2015    Procedure: OPEN REDUCTION INTERNAL FIXATION (ORIF) THUMB AND CAPSULE REPAIR;  Surgeon: Mack Hook, MD;  Location: [facility] OR;  Service: Orthopedics;  Laterality: Left;   History reviewed. No pertinent family history. History  Substance Use Topics  . Smoking status: Current Every Day Smoker -- 1.00 packs/day for 7 years    Types: Cigarettes  . Smokeless tobacco: Never Used  . Alcohol Use: 1.8 oz/week    3 Shots of liquor per week     Comment: occ    Review of Systems  Constitutional: Negative for fever.  Musculoskeletal: Positive for joint swelling and arthralgias. Negative for myalgias.  Neurological: Negative for weakness and numbness.      Allergies  Sulfa drugs cross reactors and Penicillins  Home Medications    Prior to Admission medications   Medication Sig Start Date End Date Taking? Authorizing Provider  oxyCODONE-acetaminophen (PERCOCET/ROXICET) 5-325 MG per tablet Take 1 tablet by mouth every 4 (four) hours as needed. 02/20/15   Burgess Amor, PA-C  predniSONE (DELTASONE) 10 MG tablet 6, 5, 4, 3, 2 then 1 tablet by mouth daily for 6 days total. 02/20/15   Burgess Amor, PA-C   BP 114/76 mmHg  Pulse 70  Temp(Src) 98.3 F (36.8 C) (Oral)  Resp 18  Ht  (1.905 m)  Wt 180 lb (81.647 kg)  BMI 22.50 kg/m2  SpO2 96% Physical Exam  Constitutional: He appears well-developed and well-nourished.  HENT:  Head: Atraumatic.  Neck: Normal range of motion.  Cardiovascular:  Pulses equal bilaterally  Musculoskeletal: He exhibits tenderness.       Hands: Moderate edema noted over dorsal proximal left thumb.  Well healed surgical incision without compromise.  No erythema, distal sensation intact.  Decreased ROM. Less than 2 second distal cap refill.    Neurological: He is alert. He has normal strength. He displays normal reflexes. No sensory deficit.  Skin: Skin is warm and dry.  Psychiatric: He has a normal mood and affect.    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 Initial encounter.  Recent dorsal dislocation of the 1st left MCP joint 01/13/2015.  EXAM: LEFT THUMB 2+V  COMPARISON:  01/14/2015 and 01/13/2015.  FINDINGS: Soft tissue swelling overlying the 1st MCP joint. Dystrophic calcification in the soft tissues adjacent to the distal 1st metacarpal, likely related to the prior trauma. No evidence of acute fracture or dislocation.  IMPRESSION: No acute osseous abnormality.   Electronically Signed   By: Hulan Saashomas  Lawrence M.D.   On: 02/20/2015 22:46     EKG Interpretation None      MDM   Final  diagnoses:  Thumb contusion, left, initial encounter    Patients labs and/or radiological studies were reviewed and considered during the medical decision making and disposition process.  Results were also discussed with patient. Pt has a thumb spica splint, encouraged to reapply this when he arrives home.  Continued ice,  Elevation as much as possible Prescribed oxycodone, prednisone taper (only anti inflammatory he tolerates).  F/u with Dr Janee Mornhompson for a recheck if sx do not return to baseline (and to advise him of his chronic reduced ROM).  The patient appears reasonably screened and/or stabilized for discharge and I doubt any other medical condition or other [facility] requiring further screening, evaluation, or treatment in the ED at this time prior to discharge.     Burgess AmorJulie Delfino Friesen, PA-C 02/21/15 1336  Donnetta HutchingBrian Cook, MD 02/22/15 [phone]

## 2015-09-05 ENCOUNTER — Emergency Department (HOSPITAL_COMMUNITY)
Admission: EM | Admit: 2015-09-05 | Discharge: 2015-09-05 | Disposition: A | Discharge status: Home / Self Care | Payer: Self-pay | Attending: Emergency Medicine | Admitting: Emergency Medicine

## 2015-09-05 ENCOUNTER — Encounter (HOSPITAL_COMMUNITY): Payer: Self-pay | Admitting: Emergency Medicine

## 2015-09-05 ENCOUNTER — Emergency Department (HOSPITAL_COMMUNITY): Payer: Self-pay

## 2015-09-05 DIAGNOSIS — Y9289 Other specified places as the place of occurrence of the external cause: Secondary | ICD-10-CM | POA: Insufficient documentation

## 2015-09-05 DIAGNOSIS — S32010A Wedge compression fracture of first lumbar vertebra, initial encounter for closed fracture: Secondary | ICD-10-CM | POA: Insufficient documentation

## 2015-09-05 DIAGNOSIS — Y9389 Activity, other specified: Secondary | ICD-10-CM | POA: Insufficient documentation

## 2015-09-05 DIAGNOSIS — F17210 Nicotine dependence, cigarettes, uncomplicated: Secondary | ICD-10-CM | POA: Insufficient documentation

## 2015-09-05 DIAGNOSIS — Y998 Other external cause status: Secondary | ICD-10-CM | POA: Insufficient documentation

## 2015-09-05 DIAGNOSIS — X58XXXA Exposure to other specified factors, initial encounter: Secondary | ICD-10-CM | POA: Insufficient documentation

## 2015-09-05 DIAGNOSIS — Z8659 Personal history of other mental and behavioral disorders: Secondary | ICD-10-CM | POA: Insufficient documentation

## 2015-09-05 DIAGNOSIS — R300 Dysuria: Secondary | ICD-10-CM | POA: Insufficient documentation

## 2015-09-05 DIAGNOSIS — Z880 Allergy status to penicillin: Secondary | ICD-10-CM | POA: Insufficient documentation

## 2015-09-05 LAB — URINALYSIS, ROUTINE W REFLEX MICROSCOPIC
BILIRUBIN URINE: NEGATIVE
Glucose, UA: NEGATIVE mg/dL
Hgb urine dipstick: NEGATIVE
Leukocytes, UA: NEGATIVE
Nitrite: NEGATIVE
PROTEIN: NEGATIVE mg/dL
Specific Gravity, Urine: >1.03 — ABNORMAL HIGH (ref 1.005–1.030)
pH: 5.5 (ref 5.0–8.0)

## 2015-09-05 MED ORDER — NAPROXEN 500 MG PO TABS: 500.0000 mg | ORAL_TABLET | Freq: Two times a day (BID) | ORAL | Status: DC

## 2015-09-05 MED ORDER — HYDROCODONE-ACETAMINOPHEN 5-325 MG PO TABS: ORAL_TABLET | ORAL | Status: AC

## 2015-09-05 NOTE — ED Notes (Signed)
Pt reports constant, lower back pain and dysuria x 7 days. Pt states he think he may have injured his back at work. Pt ambulatory.
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
## 2015-09-05 NOTE — Discharge Instructions (Signed)
Follow up with dr. Romeo Apple

## 2016-12-16 ENCOUNTER — Emergency Department (HOSPITAL_COMMUNITY)
Admission: EM | Admit: 2016-12-16 | Discharge: 2016-12-16 | Disposition: A | Discharge status: Home / Self Care | Payer: No Typology Code available for payment source | Attending: Emergency Medicine | Admitting: Emergency Medicine

## 2016-12-16 ENCOUNTER — Emergency Department (HOSPITAL_COMMUNITY): Payer: No Typology Code available for payment source

## 2016-12-16 ENCOUNTER — Encounter (HOSPITAL_COMMUNITY): Payer: Self-pay

## 2016-12-16 DIAGNOSIS — R930 Abnormal findings on diagnostic imaging of skull and head, not elsewhere classified: Secondary | ICD-10-CM | POA: Diagnosis not present

## 2016-12-16 DIAGNOSIS — F909 Attention-deficit hyperactivity disorder, unspecified type: Secondary | ICD-10-CM | POA: Insufficient documentation

## 2016-12-16 DIAGNOSIS — M545 Low back pain: Secondary | ICD-10-CM | POA: Diagnosis not present

## 2016-12-16 DIAGNOSIS — Y999 Unspecified external cause status: Secondary | ICD-10-CM | POA: Insufficient documentation

## 2016-12-16 DIAGNOSIS — Y939 Activity, unspecified: Secondary | ICD-10-CM | POA: Insufficient documentation

## 2016-12-16 DIAGNOSIS — F17210 Nicotine dependence, cigarettes, uncomplicated: Secondary | ICD-10-CM | POA: Insufficient documentation

## 2016-12-16 DIAGNOSIS — Y92410 Unspecified street and highway as the place of occurrence of the external cause: Secondary | ICD-10-CM | POA: Insufficient documentation

## 2016-12-16 DIAGNOSIS — R0781 Pleurodynia: Secondary | ICD-10-CM | POA: Insufficient documentation

## 2016-12-16 DIAGNOSIS — S299XXA Unspecified injury of thorax, initial encounter: Secondary | ICD-10-CM | POA: Diagnosis present

## 2016-12-16 DIAGNOSIS — R10814 Left lower quadrant abdominal tenderness: Secondary | ICD-10-CM | POA: Insufficient documentation

## 2016-12-16 LAB — COMPREHENSIVE METABOLIC PANEL
ALK PHOS: 69 U/L (ref 38–126)
ALT: 14 U/L — ABNORMAL LOW (ref 17–63)
AST: 21 U/L (ref 15–41)
Albumin: 4.4 g/dL (ref 3.5–5.0)
Anion gap: 7 (ref 5–15)
BILIRUBIN TOTAL: 0.6 mg/dL (ref 0.3–1.2)
BUN: 14 mg/dL (ref 6–20)
CALCIUM: 9.3 mg/dL (ref 8.9–10.3)
CO2: 30 mmol/L (ref 22–32)
CREATININE: 1.04 mg/dL (ref 0.61–1.24)
Chloride: 100 mmol/L — ABNORMAL LOW (ref 101–111)
GFR calc non Af Amer: >60 mL/min (ref >60)
GLUCOSE: 97 mg/dL (ref 65–99)
Potassium: 3.7 mmol/L (ref 3.5–5.1)
Sodium: 137 mmol/L (ref 135–145)
Total Protein: 7 g/dL (ref 6.5–8.1)

## 2016-12-16 LAB — CBC
HEMATOCRIT: 41.4 % (ref 39.0–52.0)
Hemoglobin: 14.1 g/dL (ref 13.0–17.0)
MCH: 29.9 pg (ref 26.0–34.0)
MCHC: 34.1 g/dL (ref 30.0–36.0)
MCV: 87.9 fL (ref 78.0–100.0)
PLATELETS: 205 10*3/uL (ref 150–400)
RBC: 4.71 MIL/uL (ref 4.22–5.81)
RDW: 13.2 % (ref 11.5–15.5)
WBC: 10.3 10*3/uL (ref 4.0–10.5)

## 2016-12-16 LAB — ETHANOL

## 2016-12-16 MED ORDER — IOPAMIDOL (ISOVUE-300) INJECTION 61%
INTRAVENOUS | Status: AC
  Administered 2016-12-16: 100 mL
  Filled 2016-12-16: qty 100

## 2016-12-16 NOTE — Discharge Instructions (Signed)
As discussed, follow up with your primary care provider. Return to the emergency department if symptoms worsen, shortness of breath, chest pain, confusion , loss of bowel or bladder function or loss of sensation in your extremities or any other new concerning symptoms. Ibuprofen for pain.
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
## 2016-12-16 NOTE — ED Notes (Signed)
Pt verbalized understanding of discharge instructions.

## 2016-12-16 NOTE — ED Notes (Signed)
Patient transported to X-ray 

## 2017-06-29 ENCOUNTER — Emergency Department (HOSPITAL_COMMUNITY): Payer: Self-pay

## 2017-06-29 ENCOUNTER — Other Ambulatory Visit: Payer: Self-pay

## 2017-06-29 ENCOUNTER — Encounter (HOSPITAL_COMMUNITY): Payer: Self-pay | Admitting: Emergency Medicine

## 2017-06-29 ENCOUNTER — Emergency Department (HOSPITAL_COMMUNITY)
Admission: EM | Admit: 2017-06-29 | Discharge: 2017-06-29 | Disposition: A | Discharge status: Home / Self Care | Payer: Self-pay | Attending: Emergency Medicine | Admitting: Emergency Medicine

## 2017-06-29 DIAGNOSIS — Z79899 Other long term (current) drug therapy: Secondary | ICD-10-CM | POA: Insufficient documentation

## 2017-06-29 DIAGNOSIS — G8929 Other chronic pain: Secondary | ICD-10-CM | POA: Insufficient documentation

## 2017-06-29 DIAGNOSIS — M545 Low back pain, unspecified: Secondary | ICD-10-CM

## 2017-06-29 DIAGNOSIS — F17210 Nicotine dependence, cigarettes, uncomplicated: Secondary | ICD-10-CM | POA: Insufficient documentation

## 2017-06-29 MED ORDER — NAPROXEN 500 MG PO TABS: 500.0000 mg | ORAL_TABLET | Freq: Two times a day (BID) | ORAL | 0 refills | Status: AC

## 2017-06-29 MED ORDER — CYCLOBENZAPRINE HCL 10 MG PO TABS
10.0000 mg | ORAL_TABLET | Freq: Two times a day (BID) | ORAL | 0 refills | Status: AC | PRN
Start: 2017-06-29 — End: ?

## 2017-06-29 MED ORDER — CYCLOBENZAPRINE HCL 10 MG PO TABS
10.0000 mg | ORAL_TABLET | Freq: Once | ORAL | Status: AC
  Administered 2017-06-29: 10 mg via ORAL
  Filled 2017-06-29: qty 1

## 2017-06-29 MED ORDER — KETOROLAC TROMETHAMINE 60 MG/2ML IM SOLN
60.0000 mg | Freq: Once | INTRAMUSCULAR | Status: AC
  Administered 2017-06-29: 60 mg via INTRAMUSCULAR
  Filled 2017-06-29: qty 2

## 2017-06-29 NOTE — ED Notes (Signed)
Pt sleeping when entered the room.

## 2017-06-29 NOTE — ED Provider Notes (Signed)
[facility] Provider Note   CSN: [id number] Arrival date & time: 06/29/17  1526     History   Chief Complaint Chief Complaint  Patient presents with  . Back Pain    HPI Kevin Robertson is a 26 y.o. male with a history of an L1 compression fracture who presents to the emergency department with sharp low back pain that radiates down the left leg as sharp pain.  He reports that the pain began shortly after he left work 3 days ago.  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  He reports that he has been off work since the pain began, but returned to work today and his pain worsened.  He reports that he has been ambulatory since the onset of pain, but states walking is painful.  He denies falls, trauma, or injury.  No urinary or fecal incontinence, numbness, paresthesias, weakness, fever, chills, rash dysuria, penile or testicular pain or swelling, abdominal pain, or N/V/D.  He reports the pain is intermittent and resolves when he has sitting or laying completely still.  No aggravating factors.  No treatment since onset of symptoms.  No history of diabetes mellitus, cancer, or IV drug use.  The history is provided by the patient. No language interpreter was used.  Back Pain   This is a new problem. The pain is associated with no known injury. The pain is present in the lumbar spine. The quality of the pain is described as stabbing. The pain radiates to the left thigh. The pain is severe. Exacerbated by: nothing. The pain is the same all the time. Pertinent negatives include no fever, no numbness, no headaches, no abdominal pain, no bowel incontinence, no perianal numbness, no bladder incontinence, no dysuria, no pelvic pain, no leg pain, no paresthesias, no paresis, no tingling and no weakness. He has tried nothing for the symptoms. Risk factors: L1 compression fracture.    Past Medical History:    Diagnosis Date  . ADHD (attention deficit hyperactivity disorder)   . Bipolar 1 disorder (HCC)     There are no active problems to display for this patient.   Past Surgical History:  Procedure Laterality Date  . OPEN REDUCTION INTERNAL FIXATION (ORIF) METACARPAL Left 01/14/2015   Procedure: OPEN REDUCTION INTERNAL FIXATION (ORIF) THUMB AND CAPSULE REPAIR;  Surgeon: Mack Hookavid Thompson, MD;  [facility];  Service: Orthopedics;  Laterality: Left;  . TYMPANOSTOMY TUBE PLACEMENT         Home Medications    Prior to Admission medications   Medication Sig Start Date End Date Taking? Authorizing Provider  cyclobenzaprine (FLEXERIL) 10 MG tablet Take 1 tablet (10 mg total) by mouth 2 (two) times daily as needed for muscle spasms. 06/29/17   Nehemiah Montee A, PA-C  HYDROcodone-acetaminophen (NORCO/VICODIN) 5-325 MG tablet 1 po q6-8h for pain Patient not taking: Reported on 12/16/2016 09/05/15   Bethann BerkshireZammit, Joseph, MD  naproxen (NAPROSYN) 500 MG tablet Take 1 tablet (500 mg total) by mouth 2 (two) times daily. 06/29/17   Rommel Hogston, Coral ElseMia A, PA-C    Family History History reviewed. No pertinent family history.  Social History Social History   Tobacco Use  . Smoking status: Current Every Day Smoker    Packs/day: 1.00    Years: 7.00    Pack years: 7.00    Types: Cigarettes  . Smokeless tobacco: Never Used  Substance Use Topics  .  Alcohol use: Yes    Alcohol/week: 1.8 oz    Types: 3 Shots of liquor per week    Comment: occ  . Drug use: No     Allergies   Sulfa drugs cross reactors and Penicillins   Review of Systems Review of Systems  Constitutional: Negative for chills and fever.  Gastrointestinal: Negative for abdominal pain, bowel incontinence, diarrhea, nausea and vomiting.  Genitourinary: Negative for bladder incontinence, discharge, dysuria, frequency, pelvic pain, penile swelling, scrotal swelling and urgency.  Musculoskeletal: Positive for arthralgias, back pain, gait  problem and myalgias. Negative for joint swelling, neck pain and neck stiffness.  Skin: Negative for rash and wound.  Allergic/Immunologic: Negative for immunocompromised state.  Neurological: Negative for tingling, weakness, numbness, headaches and paresthesias.     Physical Exam Updated Vital Signs BP 115/79 (BP Location: Right Arm)   Pulse 78   Temp 98.2 F (36.8 C) (Oral)   Resp 20   Ht 6\' 3"  (1.905 m)   Wt 79.4 kg (175 lb)   SpO2 98%   BMI 21.87 kg/m   Physical Exam  Constitutional: He appears well-developed.  HENT:  Head: Normocephalic.  Eyes: Conjunctivae are normal.  Neck: Normal range of motion. Neck supple.  Cardiovascular: Normal rate, regular rhythm, normal heart sounds and intact distal pulses. Exam reveals no gallop and no friction rub.  No murmur heard. Pulmonary/Chest: Effort normal and breath sounds normal. No stridor. No respiratory distress. He has no wheezes. He has no rales. He exhibits no tenderness.  Abdominal: Soft. He exhibits no distension. There is no tenderness.  No CVA tenderness bilaterally.  Musculoskeletal: Normal range of motion. He exhibits tenderness. He exhibits no edema or deformity.  Tender to palpation over the spinous processes of the lumbar spine.  No tenderness to palpation over the cervical or thoracic lumbar spinous processes or paraspinal muscles of the cervical, thoracic, or lumbar spine.  Antalgic gait.  DP and PT pulses are 2+ and symmetric.  5 out of 5 strength with dorsiflexion and plantarflexion.  Sensation is intact throughout the bilateral lower extremities.  Positive straight leg raise on the left.  Negative right straight leg raise.  Neurological: He is alert.  Skin: Skin is warm and dry. Capillary refill takes less than 2 seconds. No rash noted.  Psychiatric: His behavior is normal.  Nursing note and vitals reviewed.    ED Treatments / Results  Labs (all labs ordered are listed, but only abnormal results are  displayed) Labs Reviewed - No data to display  EKG  EKG Interpretation None       Radiology Dg Lumbar Spine Complete  Result Date: 06/29/2017 CLINICAL DATA:  Sudden onset of lower back pain history of L1 compression fracture with surgery EXAM: LUMBAR SPINE - COMPLETE 4+ VIEW COMPARISON:  09/05/2015, CT 12/16/2016 FINDINGS: Retrolisthesis of L5 on S1 as before. Minimal anterior wedging of L1 with chronic anterior inferior endplate deformity which may reflect limbus vertebra or old injury. This is unchanged. Vertebral body heights are maintained. Mild degenerative changes at L1-L2, L2-L3 and L5-S1 IMPRESSION: 1. No acute osseous abnormality 2. Degenerative changes as described above with chronic changes at L1. Electronically Signed   By: Jasmine Pang M.D.   On: 06/29/2017 18:23    Procedures Procedures (including critical care time)  Medications Ordered in ED Medications  ketorolac (TORADOL) injection 60 mg (not administered)  cyclobenzaprine (FLEXERIL) tablet 10 mg (not administered)     Initial Impression / Assessment and Plan / ED Course  I have reviewed the triage vital signs and the nursing notes.  Pertinent labs & imaging results that were available during my care of the patient were reviewed by me and considered in my medical decision making (see chart for details).     Patient with back pain, mechanical in nature.  No neurological deficits and normal neuro exam.  Patient can walk but states it is painful.  No urinary or bowel incontinence.  No concern for cauda equina, infection, AAA, infection, or fracture.  No constitutional symptoms including fever, chills, or weight loss,  No h/o cancer, IVDU.  Given the patient's history of an L1 fracture and tenderness to palpation on physical exam, obtained an x-ray of the lumbar spine, which demonstrated retrolisthesis of L5 on S1 and minimal anterior wedging of L1 with chronic anterior inferior endplate deformity as well as mild  degenerative changes in the lumbar spine. Will discharge to home with RICE protocol and anti-inflammatory medicine. Discussed ED return precaution and indications for Neurosurgery follow up if symptoms persist without improvement. The patient acknowledges the plan and is agreeable at this time.    Final Clinical Impressions(s) / ED Diagnoses   Final diagnoses:  Acute exacerbation of chronic low back pain    ED Discharge Orders        Ordered    naproxen (NAPROSYN) 500 MG tablet  2 times daily     06/29/17 1854    cyclobenzaprine (FLEXERIL) 10 MG tablet  2 times daily PRN     06/29/17 1854       Journi Moffa A, PA-C 06/29/17 1859    Mancel BaleWentz, Elliott, MD 06/30/17 [phone]

## 2017-06-29 NOTE — ED Notes (Signed)
Pt alert & oriented x4, stable gait. Patient given discharge instructions, paperwork & prescription(s). Patient  instructed to stop at the registration desk to finish any additional paperwork. Patient verbalized understanding. Pt left department w/ no further questions. 

## 2017-06-29 NOTE — Discharge Instructions (Signed)
Take 500 mg of naproxen twice daily with food to avoid upsetting your stomach.  Do not take ibuprofen, Motrin or other NSAIDs while taking this medication.  Take 1 tablet of Flexeril up to 2 times daily to help with muscle spasms.  Please do not take this medication if you have to work or driving because it can cause you to be drowsy.  Please stretch as your pain allows.  You can apply ice or heat for 15-20 minutes up to 3-4 times a day to help improve your symptoms.   If your symptoms do not start to improve within the next week, you may need to call neurosurgery to schedule a follow-up appointment.  Your x-ray today showed some chronic changes around where you had a fracture of your spine a couple of years ago, but nothing that is an emergency.  If you develop new or worsening symptoms, including you become unable to walk, peeing or pooping on yourself, or if you develop a rash, or other concerning symptoms, please return to the emergency department for reevaluation.

## 2017-06-29 NOTE — ED Triage Notes (Signed)
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

## 2018-06-09 IMAGING — DX DG LUMBAR SPINE COMPLETE 4+V
5 series · 5 of 5 positions shown · non-contrast
Comparison: 09/05/2015, CT 12/16/2016

CLINICAL DATA: Sudden onset of lower back pain history of L1
compression fracture with surgery

EXAM:
LUMBAR SPINE - COMPLETE 4+ VIEW

[l-spine ap]
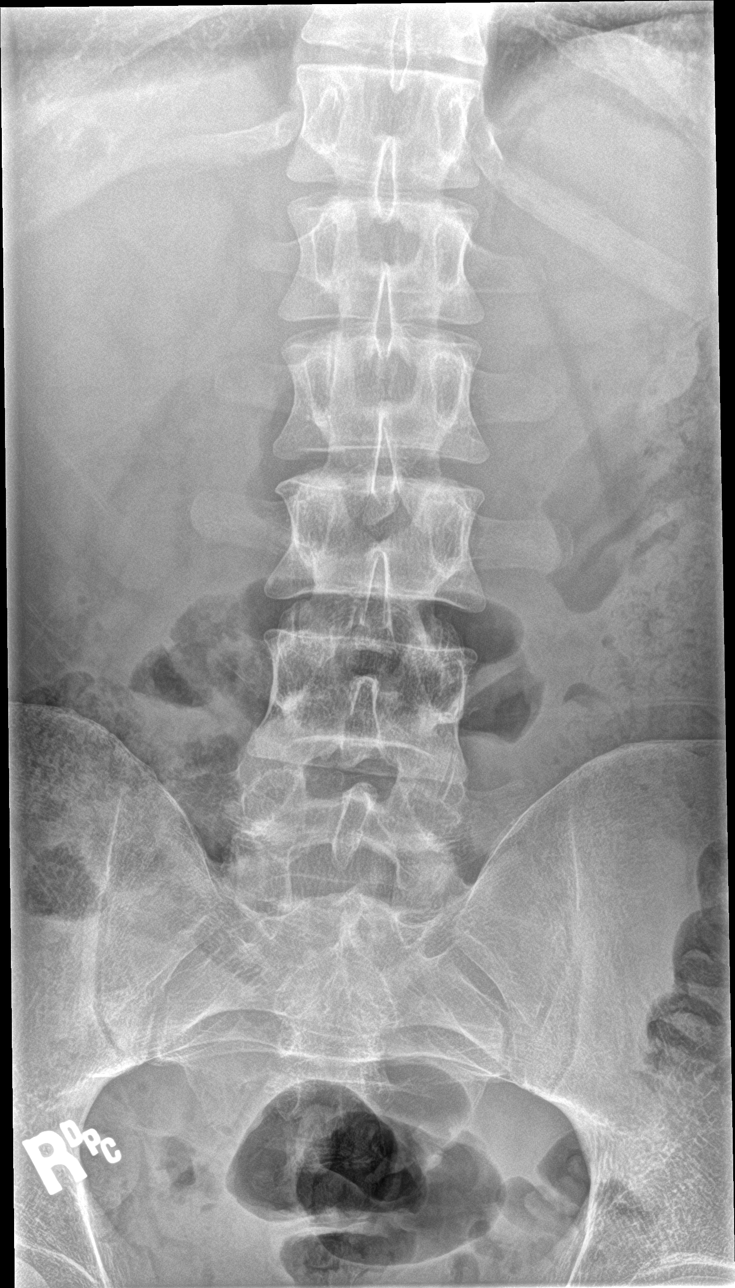

[l-spine obl (1 of 2)]
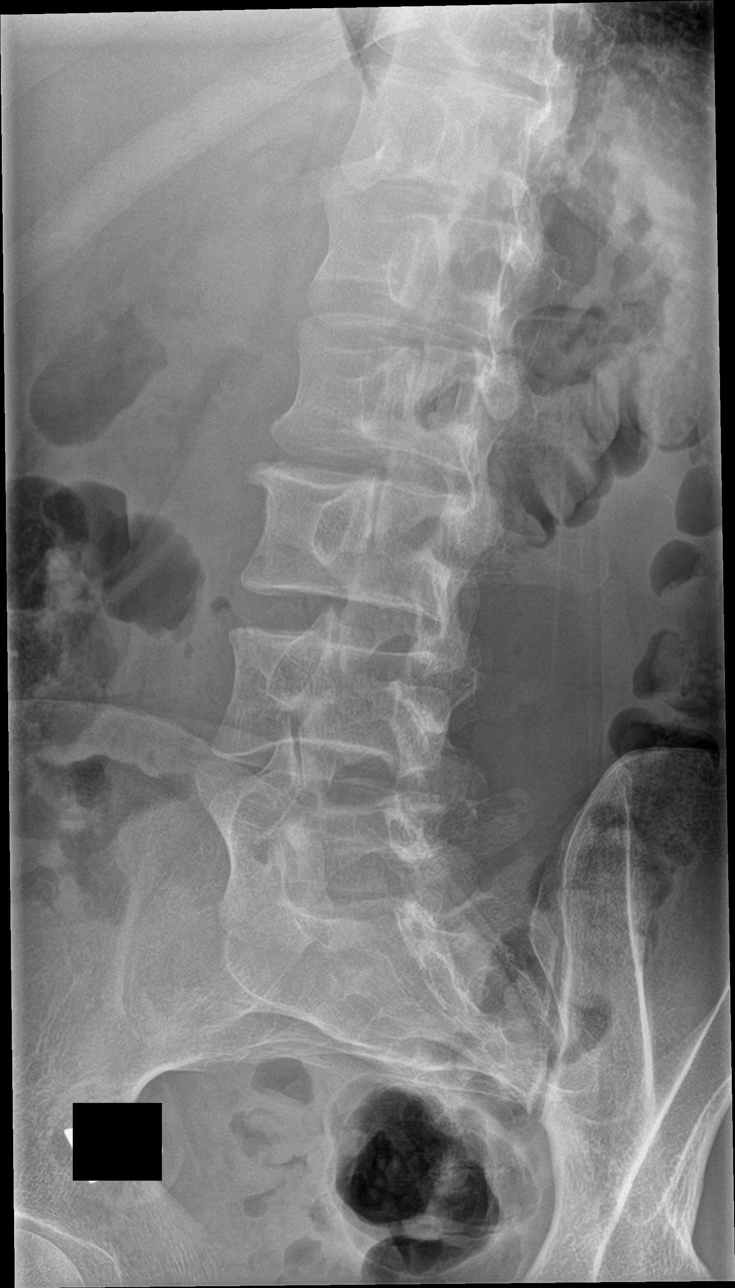

[l-spine obl (2 of 2)]
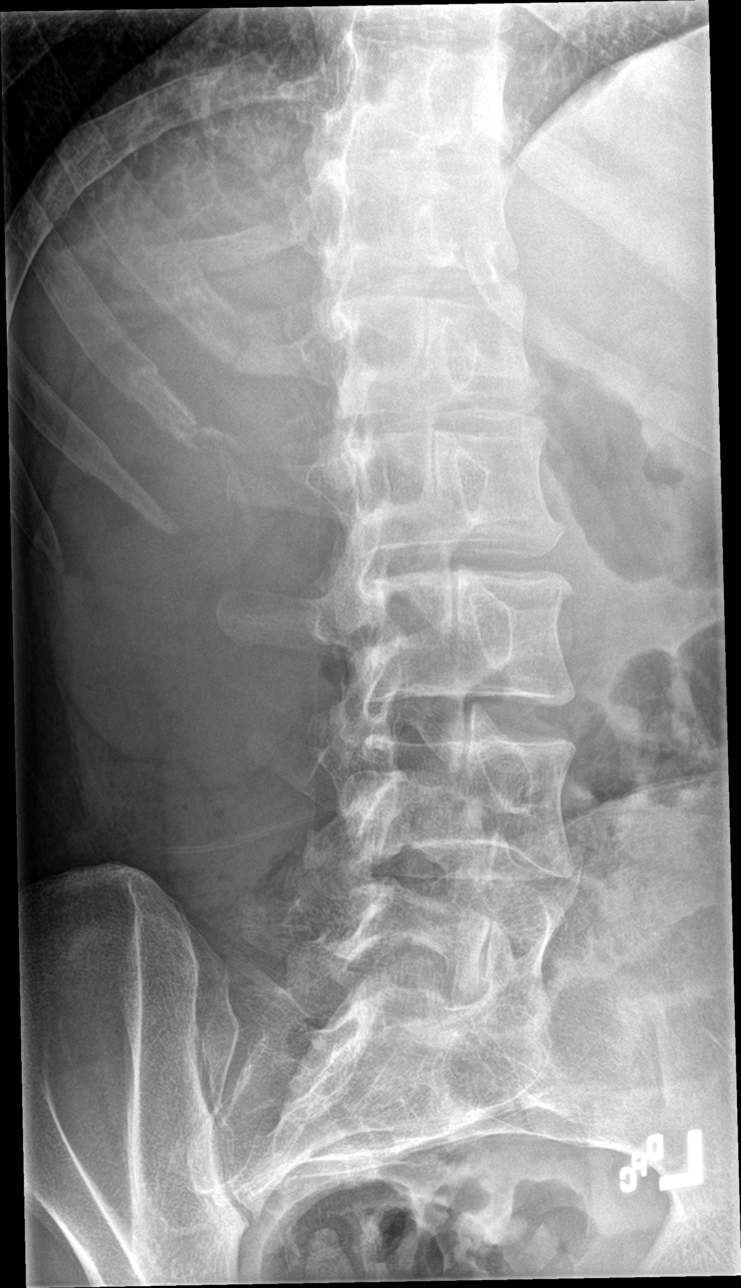

[l-spine lat]
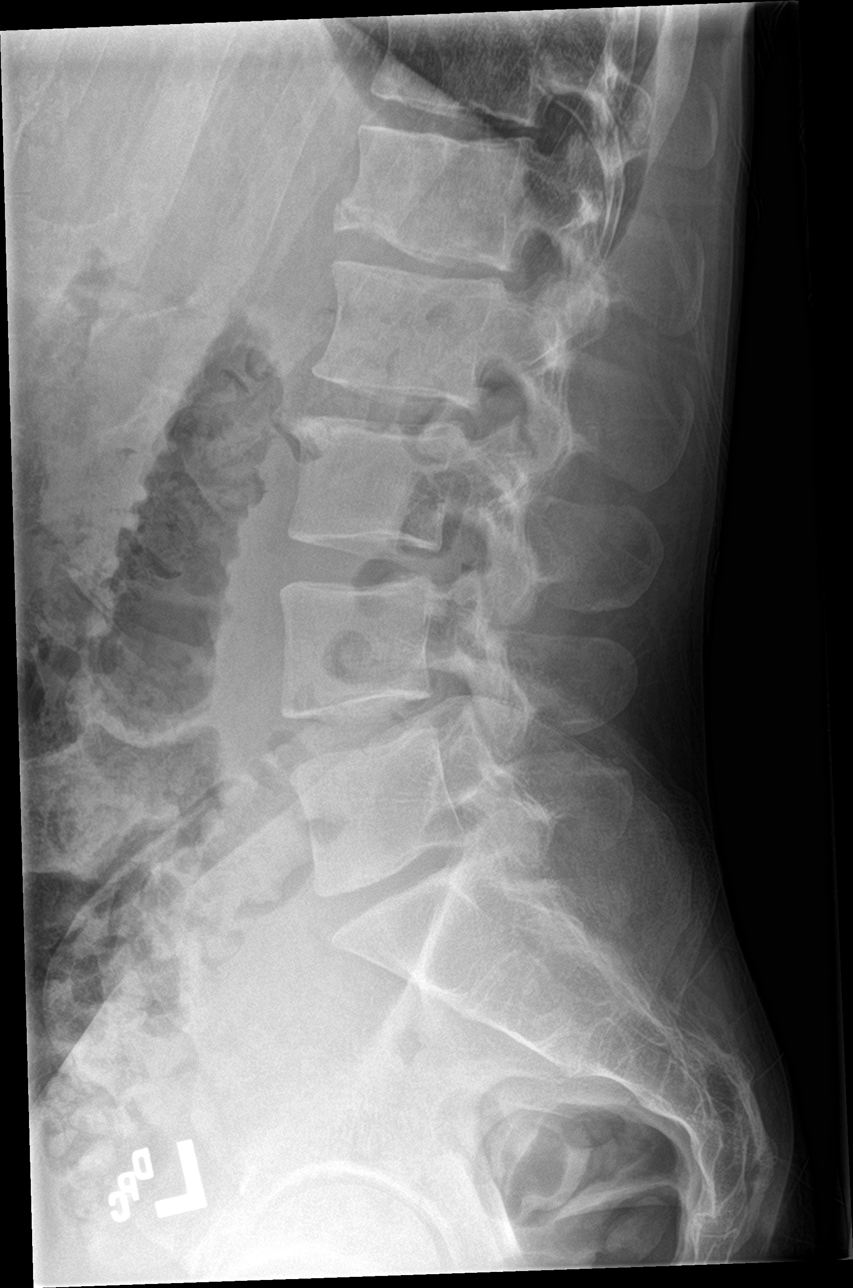

[l-spine spot]
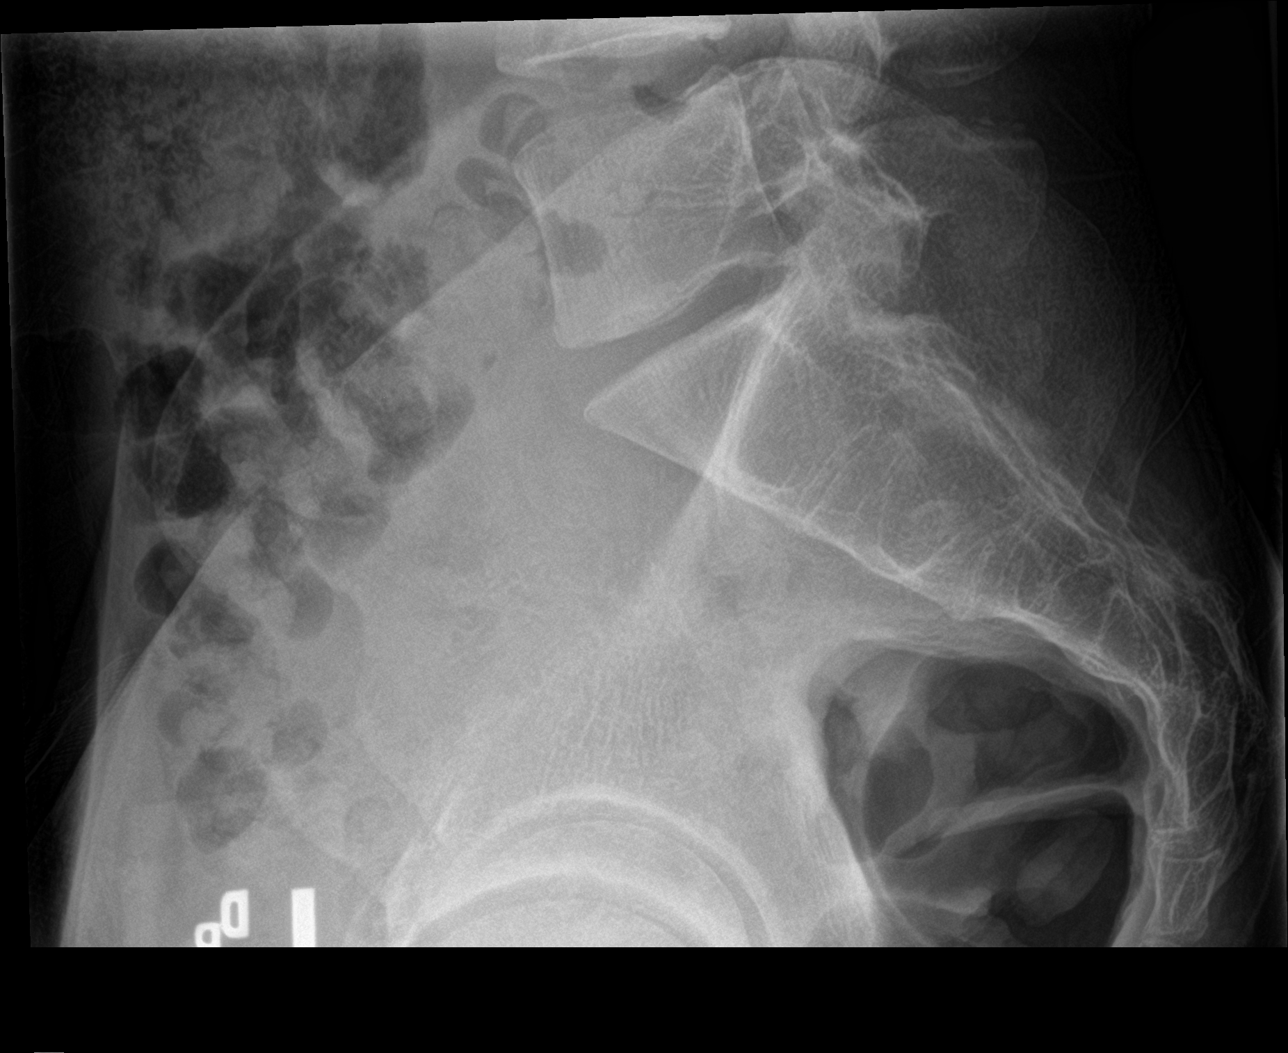

[5 of 5 positions shown; findings below may reference images not displayed]

FINDINGS: Retrolisthesis of L5 on S1 as before. Minimal anterior wedging of L1
with chronic anterior inferior endplate deformity which may reflect
limbus vertebra or old injury. This is unchanged. Vertebral body
heights are maintained. Mild degenerative changes at L1-L2, L2-L3
and L5-S1
IMPRESSION: 1. No acute osseous abnormality
2. Degenerative changes as described above with chronic changes at
L1.

## 2023-04-07 DIAGNOSIS — F17210 Nicotine dependence, cigarettes, uncomplicated: Secondary | ICD-10-CM | POA: Diagnosis not present

## 2023-04-07 DIAGNOSIS — K5900 Constipation, unspecified: Secondary | ICD-10-CM | POA: Diagnosis not present

## 2023-04-07 DIAGNOSIS — R109 Unspecified abdominal pain: Secondary | ICD-10-CM | POA: Diagnosis not present

## 2023-04-07 DIAGNOSIS — R1084 Generalized abdominal pain: Secondary | ICD-10-CM | POA: Diagnosis not present

## 2023-04-07 DIAGNOSIS — F1120 Opioid dependence, uncomplicated: Secondary | ICD-10-CM | POA: Diagnosis not present
